# Patient Record
Sex: Female | Born: 1990 | Race: Black or African American | Hispanic: No | Marital: Married | Smoking: Never smoker
Health system: Southern US, Community
[De-identification: ages and names within clinical notes are randomized; demographics above are authoritative.]

## PROBLEM LIST (undated history)

## (undated) DIAGNOSIS — T7840XA Allergy, unspecified, initial encounter: Secondary | ICD-10-CM

## (undated) DIAGNOSIS — R7303 Prediabetes: Secondary | ICD-10-CM

## (undated) DIAGNOSIS — Z789 Other specified health status: Secondary | ICD-10-CM

## (undated) DIAGNOSIS — O139 Gestational [pregnancy-induced] hypertension without significant proteinuria, unspecified trimester: Secondary | ICD-10-CM

## (undated) DIAGNOSIS — I10 Essential (primary) hypertension: Secondary | ICD-10-CM

## (undated) HISTORY — PX: BREAST SURGERY: SHX581

## (undated) HISTORY — DX: Allergy, unspecified, initial encounter: T78.40XA

## (undated) NOTE — Progress Notes (Signed)
 Formatting of this note is different from the original. Patient: Sarah Giles Date of Birth: [birth year] Visit Date: 04/19/24   Chief Complaint    Chief Complaint  Patient presents with   Wound Check    Needs packing changed under left from abscess that was drained 2 days ago     History of Present Illness    History provided by:  Patient Language interpreter used: No    Pt had an abscess that needed drained in [place] two days ago She had packing placed, and a lot of drainage at the time. She was started on doxy Pt needs packing removed and to check if repacking is needed.    Allergies   Past Surgical History   Allergies  Allergen Reactions   Amlodipine Other and Unknown    Other Reaction(s): palpitations/chest pains  Severe adverse reaction per MD  Severe adverse reaction per MD, flushed   Bee Pollen Itching   Hydroxyzine  Palpitations   Iodinated Contrast Media Hives and Nausea And Vomiting   Other Other and Unknown   Polymyxin B-Trimethoprim Other    Eye burning   Tobramycin Other    Eye burning   History reviewed. No pertinent surgical history.     Past Medical History   Medications   History reviewed. No pertinent past medical history. There is no problem list on file for this patient.  Current Outpatient Medications:    albuterol HFA (Ventolin HFA) 90 mcg/act inhaler, 2 puffs as needed Inhalation every 4 hrs prn for cough/wheeze; Duration: 90 days, Disp: , Rfl:    Arazlo  0.045 % lotion, Apply ONE Application topically AT bedtime., Disp: , Rfl:    cetirizine (ZyrTEC) 10 MG tablet, 1 time each day at the same time., Disp: , Rfl:    clindamycin  (Cleocin  T) 1 % swab, APPLY TOPICALLY IN THE MORNING, Disp: , Rfl:    ergocalciferol (Vitamin D-2) capsule, 1 capsule., Disp: , Rfl:    fluticasone furoate (Flonase Sensimist) 27.5 MCG/SPRAY nasal spray, 1-2 sprays each nostril Nasally Once a day during seasons of difficulty; Duration: 30 days, Disp: , Rfl:    magnesium  citrate oral solution, Take 296 mL by mouth., Disp: , Rfl:    metoprolol tartrate (Lopressor) 25 MG tablet, TAKE ONE TABLET BY MOUTH 2 TIMES DAILY., Disp: , Rfl:    Multiple Vitamins-Minerals (Centrum Women) tablet, 1 tab by mouth once a day, Disp: , Rfl:    spironolactone  (Aldactone ) 100 MG tablet, Take 100 mg by mouth at bedtime., Disp: , Rfl:    Zepbound 10 MG/0.5ML pen-injector, , Disp: , Rfl:     Review of Systems   Review of Systems  Constitutional:  Negative for chills and fever.  HENT:  Negative for facial swelling, rhinorrhea and sore throat.   Eyes:  Negative for discharge, redness and visual disturbance.  Respiratory:  Negative for cough and shortness of breath.   Cardiovascular:  Negative for chest pain.  Gastrointestinal:  Negative for abdominal pain, constipation, diarrhea, nausea and vomiting.  Genitourinary:  Negative for difficulty urinating.  Musculoskeletal:  Negative for myalgias.  Skin:  Positive for wound. Negative for color change, pallor and rash.  Neurological:  Negative for dizziness, weakness, numbness and headaches.  Hematological:  Negative for adenopathy. Does not bruise/bleed easily.    Vitals and Physical Exam    Vitals:   04/19/24 1124  BP: 120/88  Pulse: 88  Temp: 36.2 C (97.2 F)  Weight: (!) 138 kg (304 lb)  Height: 1.702  m (5' 7)  BMI (Calculated): 47.6 kg/m2  BSA (Calculated - sq m): 2.55 sq meters  Resp: 16  SpO2: 98%   LMP: Patient's last menstrual period was 04/12/2024 (approximate). Physical Exam Vitals and nursing note reviewed.  Constitutional:      General: She is not in acute distress.    Appearance: Normal appearance.  HENT:     Head: Normocephalic and atraumatic.     Nose: Nose normal. No congestion or rhinorrhea.     Mouth/Throat:     Mouth: Mucous membranes are moist.     Pharynx: No oropharyngeal exudate or posterior oropharyngeal erythema.  Eyes:     General:        Right eye: No discharge.        Left eye: No  discharge.     Conjunctiva/sclera: Conjunctivae normal.  Cardiovascular:     Rate and Rhythm: Normal rate and regular rhythm.     Heart sounds: Normal heart sounds.  Pulmonary:     Effort: Pulmonary effort is normal.     Breath sounds: Normal breath sounds. No wheezing or rhonchi.  Musculoskeletal:     Cervical back: Neck supple.  Lymphadenopathy:     Cervical: No cervical adenopathy.  Skin:    General: Skin is warm.     Findings: No rash.     Comments: Small left axillar incision, no drainage, no redness, no tenderness. Small packing removed scant discharge noted on it  Neurological:     General: No focal deficit present.     Mental Status: She is alert.  Psychiatric:        Mood and Affect: Mood normal.        Behavior: Behavior normal.    Procedures - POCT - Imaging    Results for orders placed or performed in visit on 11/19/23  Basic metabolic panel   Collection Time: 11/19/23 12:38 PM  Result Value Ref Range   Glucose 72 65 - 99 mg/dL   Urea Nitrogen (BUN) 15 7 - 25 mg/dL   Creatinine 9.23 9.49 - 0.97 mg/dL   EGFR 894 > OR = 60 fO/fpw/8.26f7   BUN/Creat Ratio SEE NOTE: 6 - 22 (calc)   Sodium 136 135 - 146 mmol/L   Potassium 4.4 3.5 - 5.3 mmol/L   Chloride 104 98 - 110 mmol/L   Carbon Dioxide 25 20 - 32 mmol/L   Calcium 9.6 8.6 - 10.2 mg/dL  CBC and differential   Collection Time: 11/19/23 12:38 PM  Result Value Ref Range   White Blood Cell Count 10.9 (H) 3.8 - 10.8 Thousand/uL   Red Blood Cell Count 5.21 (H) 3.80 - 5.10 Million/uL   Hemoglobin 12.9 11.7 - 15.5 g/dL   Hemotocrit 57.8 64.9 - 45.0 %   MCV 80.8 80.0 - 100.0 fL   MCH 24.8 (L) 27.0 - 33.0 pg   MCHC 30.6 (L) 32.0 - 36.0 g/dL   RDW 85.0 88.9 - 84.9 %   Platelet Count 393 140 - 400 Thousand/uL   MPV 8.9 7.5 - 12.5 fL   Absolute Neutrophils 7,423 1,500 - 7,800 cells/uL   Absolute Lymphocytes 2,780 850 - 3,900 cells/uL   Absolute Monocytes 589 200 - 950 cells/uL   Absolute Eosinophils 44 15 - 500  cells/uL   Absolute Basophils 65 0 - 200 cells/uL   Neutrophils 68.1 %   Lymphocytes 25.5 %   Monocytes 5.4 %   Eosinophils 0.4 %   Basophils 0.6 %  Magnesium   Collection  Time: 11/19/23 12:38 PM  Result Value Ref Range   Magnesium 2.2 1.5 - 2.5 mg/dL     Medical Decision Making     Assessment and Plan   1. Encounter for removal of abscess packing (Primary)  Electronically Signed by:  Nada F Saleeb, PA-C 04/19/2024  Patient Instructions  Packing removed in office today, very scant drainage on packing, no repacking necessary at this time, pt will follow up with PCP on Monday. Pt can now shower normal and redress area when done, she was provided with dressing and wound redressed in office. Continue AB as prescribed from PCP.   Follow Up: Follow up if symptoms worsen or fail to improve.  Electronically signed by Iantha JULIANNA Pilsner, PA-C at 04/19/2024 12:19 PM EST

## (undated) NOTE — Progress Notes (Signed)
 Formatting of this note is different from the original. Patient: Sarah Giles Date of Birth: October 30, 1990 Visit Date: 11/19/23   Chief Complaint    Chief Complaint  Patient presents with   Dizziness    Pt was seen in another state and EKG was normal, BP was normal..  Pt was also Zofran  for nausea in office.  Pt just wants a follow up.    No dizziness today but does have ringing in her ears.    History of Present Illness   Sarah Giles is a 69 y.o. female presenting to Urgent Care for re-check after an episode of intense dizziness that occurred yesterday. She was traveling by car (passenger) from [place]  to [place]  when it occurred, she had turned her head kind of suddenly to the side and the episode started; she got very nauseous and vomited. She was seen at an Urgent Care along her route who did EKG and orthostatic BP both of which were normal, provided her Zofran  and suggested recheck/possible bloodwork. Patient has not had recurrence of symptoms.   Endorses abdominal pain associated with menstrual cramps, baseline. Denies LOC or recurrent episode since yesterday. Denies fevers, shortness of breath, chest pain, nausea or vomiting, changes to stools.    Allergies   Past Surgical History   Allergies  Allergen Reactions   Bee Pollen Itching   Hydroxyzine  Palpitations   Amlodipine Other and Unknown    Other Reaction(s): palpitations/chest pains  Severe adverse reaction per MD   Iodinated Contrast Media Hives and Nausea And Vomiting   Other Other and Unknown   Polymyxin B-Trimethoprim Other    Eye burning   Tobramycin Other    Eye burning   No past surgical history on file.     Past Medical History   Medications   There is no problem list on file for this patient.  Current Outpatient Medications:    albuterol HFA (Ventolin HFA) 90 mcg/act inhaler, 2 puffs as needed Inhalation every 4 hrs prn for cough/wheeze; Duration: 90 days, Disp: , Rfl:    Arazlo  0.045 % lotion, Apply  ONE Application topically AT bedtime., Disp: , Rfl:    cetirizine (ZyrTEC) 10 MG tablet, 1 time each day at the same time., Disp: , Rfl:    clindamycin  (Cleocin  T) 1 % swab, APPLY TOPICALLY IN THE MORNING, Disp: , Rfl:    ergocalciferol (Vitamin D-2) capsule, 1 capsule., Disp: , Rfl:    fluticasone furoate (Flonase Sensimist) 27.5 MCG/SPRAY nasal spray, 1-2 sprays each nostril Nasally Once a day during seasons of difficulty; Duration: 30 days, Disp: , Rfl:    metoprolol tartrate (Lopressor) 25 MG tablet, TAKE ONE TABLET BY MOUTH 2 TIMES DAILY., Disp: , Rfl:    spironolactone  (Aldactone ) 100 MG tablet, Take 100 mg by mouth at bedtime., Disp: , Rfl:    Zepbound 10 MG/0.5ML pen-injector, , Disp: , Rfl:    magnesium citrate oral solution, Take 296 mL by mouth., Disp: , Rfl:    Multiple Vitamins-Minerals (Centrum Women) tablet, 1 tab by mouth once a day, Disp: , Rfl:    ondansetron  ODT (Zofran -ODT) 4 MG disintegrating tablet, Place 1 tablet under the tongue every 8 hours if needed for nausea or vomiting for up to 3 days., Disp: 9 tablet, Rfl: 0    Vitals and Physical Exam    Vitals:   11/19/23 1147  BP: (!) 145/91  Pulse: 87  Resp: 20  Temp: 36.4 C (97.6 F)  TempSrc: Temporal  SpO2: 98%  Weight:  132 kg (292 lb)  Height: 1.676 m (5' 6)   Vision: No results found.  LMP: No LMP recorded. Physical Exam Vitals and nursing note reviewed.  Constitutional:      General: She is not in acute distress.    Appearance: Normal appearance. She is not ill-appearing or toxic-appearing.  HENT:     Right Ear: Tympanic membrane, ear canal and external ear normal.     Left Ear: Ear canal and external ear normal. A middle ear effusion is present.     Nose: Nose normal. No congestion or rhinorrhea.     Mouth/Throat:     Mouth: Mucous membranes are moist. No oral lesions.     Pharynx: Oropharynx is clear. Uvula midline. No posterior oropharyngeal erythema.  Eyes:     Conjunctiva/sclera: Conjunctivae  normal.  Cardiovascular:     Rate and Rhythm: Normal rate and regular rhythm.  Pulmonary:     Effort: Pulmonary effort is normal. No tachypnea, accessory muscle usage or respiratory distress.     Breath sounds: Normal breath sounds. No decreased breath sounds, wheezing, rhonchi or rales.  Musculoskeletal:     Cervical back: Neck supple.  Lymphadenopathy:     Cervical: No cervical adenopathy.  Skin:    General: Skin is warm and dry.     Coloration: Skin is not pale.  Neurological:     Mental Status: She is alert.    Medical Decision Making    Unclear etiology to patient's dizziness episode yesterday, but I suspect has to do with middle ear effusion and elevation changes/travel. Pt endorses hx anemia - will recheck this and basic electrolytes to make sure that is not contributing factor. Overall recommend meclizine  if she can tolerate, but avoiding exacerbating head movements and when she travels again to take pseudoephedrine beforehand, have Zofran  if needed. Follow up with PCP. Discussed ED precautions. Other education/discussion as below. Pt verbalized understanding and agrees with this treatment plan.   Assessment and Plan   1. Episode of dizziness (Primary) -     Basic metabolic panel -     CBC and differential -     Magnesium 2. Nausea -     ondansetron  ODT (Zofran -ODT) 4 MG disintegrating tablet; Place 1 tablet under the tongue every 8 hours if needed for nausea or vomiting for up to 3 days.  Electronically Signed by:  Heather Stallion, PA-C 11/19/2023  Patient Instructions  You were seen today for DIZZINESS, which can be caused by many conditions. Based on your symptoms and physical exam, I suspect your dizziness is caused by a condition of the middle ear/fluid and pressure changes associated with car travel across states.  We are ordering lab tests to further evaluate your symptoms. We will call with results, but the easiest/fastest way to obtain your results is by signing up  for the [facility] patient portal. Most standard labs return same or next day.    -If some of your results are abnormal but stable, it is very important to follow up with your primary care provider (PCP) to discuss next steps or treatment. If any of your results are significantly abnormal or at dangerous/unstable levels, we may refer you to the emergency department for further evaluation and treatment.  AVOID TRIGGERS such as: rapidly changing head positions, looking up or down suddenly, rolling over in bed quickly.  MEDICATIONS: (for short-term use only) -antihistamines like meclizine  or diphenhydramine  (Benadryl ) can help with symptoms of vertigo -antiemetics like ondansetron  (Zofran ) can help with nausea  associated with BPPV  FOLLOW UP: If your symptoms persist or worsen, follow up with your primary care provider (PCP). They may recommend additional treatments, such as vestibular rehabilitation therapy, which involves exercises to improve balance and reduce dizziness.  SEEK EMERGENCY ROOM (ER) CARE IF YOU EXPERIENCE: -severe, persistent vertigo that does not improve with repositioning maneuvers -vertigo accompanied by severe headaches, weakness, double vision or vision changes, or difficulty speaking -sudden hearing loss or ringing in the ears -sudden severe headache (worst headache of your life with quick onset), or acutely worsening and intolerable headaches not relieved with Tylenol /ibuprofen  -profuse nausea/vomiting -severe neck pain -sudden trouble walking or difficulty with moving any part of your body  Follow Up: No follow-ups on file.  Electronically signed by Heather Stallion, PA-C at 11/19/2023  5:20 PM EDT

---
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
## 2011-10-27 ENCOUNTER — Other Ambulatory Visit (HOSPITAL_COMMUNITY)
Admission: RE | Admit: 2011-10-27 | Discharge: 2011-10-27 | Disposition: A | Discharge status: Home / Self Care | Payer: No Typology Code available for payment source | Source: Ambulatory Visit | Attending: Obstetrics and Gynecology | Admitting: Obstetrics and Gynecology

## 2011-10-27 DIAGNOSIS — Z01419 Encounter for gynecological examination (general) (routine) without abnormal findings: Secondary | ICD-10-CM | POA: Insufficient documentation

## 2011-10-27 DIAGNOSIS — Z113 Encounter for screening for infections with a predominantly sexual mode of transmission: Secondary | ICD-10-CM | POA: Insufficient documentation

## 2011-10-27 DIAGNOSIS — N760 Acute vaginitis: Secondary | ICD-10-CM | POA: Insufficient documentation

## 2012-07-21 ENCOUNTER — Other Ambulatory Visit: Payer: Self-pay | Admitting: Nurse Practitioner

## 2012-07-21 ENCOUNTER — Other Ambulatory Visit (HOSPITAL_COMMUNITY)
Admission: RE | Admit: 2012-07-21 | Discharge: 2012-07-21 | Disposition: A | Discharge status: Home / Self Care | Payer: No Typology Code available for payment source | Source: Ambulatory Visit | Attending: Obstetrics and Gynecology | Admitting: Obstetrics and Gynecology

## 2012-07-21 DIAGNOSIS — N760 Acute vaginitis: Secondary | ICD-10-CM | POA: Insufficient documentation

## 2012-12-08 ENCOUNTER — Other Ambulatory Visit: Payer: Self-pay | Admitting: Nurse Practitioner

## 2012-12-08 ENCOUNTER — Other Ambulatory Visit (HOSPITAL_COMMUNITY)
Admission: RE | Admit: 2012-12-08 | Discharge: 2012-12-08 | Disposition: A | Discharge status: Home / Self Care | Payer: BC Managed Care – PPO | Source: Ambulatory Visit | Attending: Obstetrics and Gynecology | Admitting: Obstetrics and Gynecology

## 2012-12-08 DIAGNOSIS — Z01419 Encounter for gynecological examination (general) (routine) without abnormal findings: Secondary | ICD-10-CM | POA: Insufficient documentation

## 2012-12-08 DIAGNOSIS — Z113 Encounter for screening for infections with a predominantly sexual mode of transmission: Secondary | ICD-10-CM | POA: Insufficient documentation

## 2013-02-16 ENCOUNTER — Ambulatory Visit (INDEPENDENT_AMBULATORY_CARE_PROVIDER_SITE_OTHER): Payer: BC Managed Care – PPO | Admitting: Physician Assistant

## 2013-02-16 VITALS — BP 138/96 | HR 130 | Temp 99.9°F | Resp 20 | Ht 67.0 in | Wt 279.8 lb

## 2013-02-16 DIAGNOSIS — J029 Acute pharyngitis, unspecified: Secondary | ICD-10-CM

## 2013-02-16 LAB — POCT CBC
Granulocyte percent: 41.8 %G (ref 37–80)
Hemoglobin: 10.8 g/dL — AB (ref 12.2–16.2)
MID (cbc): 0.6 (ref 0–0.9)
MPV: 6.3 fL (ref 0–99.8)
POC Granulocyte: 2.7 (ref 2–6.9)
POC MID %: 9.1 %M (ref 0–12)
Platelet Count, POC: 236 10*3/uL (ref 142–424)
RBC: 4.39 M/uL (ref 4.04–5.48)
WBC: 6.4 10*3/uL (ref 4.6–10.2)

## 2013-02-16 LAB — POCT RAPID STREP A (OFFICE): Rapid Strep A Screen: NEGATIVE

## 2013-02-16 MED ORDER — AMOXICILLIN-POT CLAVULANATE 875-125 MG PO TABS: 1.0000 | ORAL_TABLET | Freq: Two times a day (BID) | ORAL | Status: DC

## 2013-02-16 NOTE — Progress Notes (Signed)
Patient ID: Sarah Giles MRN: [id number], DOB: 12/11/1990, 22 y.o. Date of Encounter: 02/16/2013, 8:54 PM  Primary Physician: No PCP Per Patient  Chief Complaint:  Chief Complaint  Patient presents with  . Sore Throat    right side very sore per patient  . Otitis Media    right ear pain  . Nasal Congestion    HPI: 22 y.o. female presents with a 3-4 day history of sore throat, fever, chills, headache, and right otalgia. Subjective fever and chills. Mild cough and congestion that developed today. No rhinorrhea or sinus pressure. Normal hearing. No GI complaints. Able to swallow saliva, but hurts to do so. Decreased appetite secondary to sore throat. Several sick contacts at work the previous week.   She did have a headache the previous week, but thinks that was secondary to not wearing her glasses. Once she started wearing her glasses again the headache resolved.   Past Medical History  Diagnosis Date  . Allergy      Home Meds: Prior to Admission medications   Medication Sig Start Date End Date Taking? Authorizing Provider  etonogestrel-ethinyl estradiol (NUVARING) 0.12-0.015 MG/24HR vaginal ring Place 1 each vaginally every 28 (twenty-eight) days. Insert vaginally and leave in place for 3 consecutive weeks, then remove for 1 week.   Yes Historical Provider, MD    Allergies: No Known Allergies  History   Social History  . Marital Status: Unknown    Spouse Name: N/A    Number of Children: N/A  . Years of Education: N/A   Occupational History  . Not on file.   Social History Main Topics  . Smoking status: Never Smoker   . Smokeless tobacco: Not on file  . Alcohol Use: 1.5 oz/week    3 drink(s) per week  . Drug Use: No  . Sexual Activity: Not on file   Other Topics Concern  . Not on file   Social History Narrative  . No narrative on file     Review of Systems: Constitutional: positive for fever and chills HEENT: see above Cardiovascular: negative for chest  pain or palpitations Respiratory: negative for wheezing, or shortness of breath Abdominal: negative for abdominal pain, nausea, vomiting or diarrhea Dermatological: negative for rash Neurologic: positive for headache   Physical Exam: Blood pressure 138/96, pulse 130, temperature 99.9 F (37.7 C), temperature source Oral, resp. rate 20, height 5\' 7"  (1.702 m), weight 279 lb 12.8 oz (126.916 kg), last menstrual period 02/02/2013, SpO2 99.00%., Body mass index is 43.81 kg/(m^2). Pulse reheck at 92.Patient states that she had just gotten off the phone call that caused her some stress.  General: Well developed, well nourished, in no acute distress. Well appearing.  Head: Normocephalic, atraumatic, eyes without discharge, sclera non-icteric, nares are patent. Bilateral auditory canals clear, TM's are without perforation, left TM pearly grey with reflective cone of light. Right TM mildly erythematous. No sinus TTP. Oral cavity moist, dentition normal. Posterior pharynx erythematous and with exudate bilaterally. Tonsils 2+ bilaterally. No post nasal drip or peritonsillar abscess. Uvula midline.  Neck: Supple. No thyromegaly. Full ROM. Lymph nodes: less than 2 cm AC bilaterally. Lungs: Clear bilaterally to auscultation without wheezes, rales, or rhonchi. Breathing is unlabored. Heart: RRR with S1 S2. No murmurs, rubs, or gallops appreciated. Msk:  Strength and tone normal for age. Extremities: No clubbing or cyanosis. No edema. Neuro: Alert and oriented X 3. Moves all extremities spontaneously. CNII-XII grossly in tact. Psych:  Responds to questions appropriately with a  normal affect.   Labs: Results for orders placed in visit on 02/16/13  POCT RAPID STREP A (OFFICE)      Result Value Range   Rapid Strep A Screen Negative  Negative  POCT CBC      Result Value Range   WBC 6.4  4.6 - 10.2 K/uL   Lymph, poc 3.1  0.6 - 3.4   POC LYMPH PERCENT 49.1  10 - 50 %L   MID (cbc) 0.6  0 - 0.9   POC MID %  9.1  0 - 12 %M   POC Granulocyte 2.7  2 - 6.9   Granulocyte percent 41.8  37 - 80 %G   RBC 4.39  4.04 - 5.48 M/uL   Hemoglobin 10.8 (*) 12.2 - 16.2 g/dL   HCT, POC 14.7 (*) 82.9 - 47.9 %   MCV 80.6  80 - 97 fL   MCH, POC 24.6 (*) 27 - 31.2 pg   MCHC 30.5 (*) 31.8 - 35.4 g/dL   RDW, POC 56.2     Platelet Count, POC 236  142 - 424 K/uL   MPV 6.3  0 - 99.8 fL     Throat culture pending  ASSESSMENT AND PLAN:  22 y.o. female with exudative pharyngitis  -Augmentin 875/125 mg 1 po bid #20 no RF -Await culture results -New toothbrush -Tylenol/Motrin prn -Rest/fluids -RTC precautions -RTC 3-5 days if no improvement  Signed, Eula Listen, PA-C [facility] [place], [place] [address] [phone] 02/16/2013 8:54 PM

## 2013-07-05 ENCOUNTER — Other Ambulatory Visit (HOSPITAL_COMMUNITY)
Admission: RE | Admit: 2013-07-05 | Discharge: 2013-07-05 | Disposition: A | Discharge status: Home / Self Care | Payer: 59 | Source: Ambulatory Visit | Attending: Obstetrics & Gynecology | Admitting: Obstetrics & Gynecology

## 2013-07-05 ENCOUNTER — Other Ambulatory Visit: Payer: Self-pay | Admitting: Obstetrics & Gynecology

## 2013-07-05 DIAGNOSIS — Z01419 Encounter for gynecological examination (general) (routine) without abnormal findings: Secondary | ICD-10-CM | POA: Insufficient documentation

## 2013-08-28 LAB — OB RESULTS CONSOLE RPR: RPR: NONREACTIVE

## 2013-08-28 LAB — OB RESULTS CONSOLE ANTIBODY SCREEN: Antibody Screen: NEGATIVE

## 2013-08-28 LAB — OB RESULTS CONSOLE HIV ANTIBODY (ROUTINE TESTING): HIV: NONREACTIVE

## 2013-08-28 LAB — OB RESULTS CONSOLE ABO/RH: RH Type: POSITIVE

## 2013-08-28 LAB — OB RESULTS CONSOLE HEPATITIS B SURFACE ANTIGEN: Hepatitis B Surface Ag: NEGATIVE

## 2013-08-28 LAB — OB RESULTS CONSOLE GC/CHLAMYDIA
Chlamydia: NEGATIVE
GC PROBE AMP, GENITAL: NEGATIVE

## 2013-08-28 LAB — OB RESULTS CONSOLE RUBELLA ANTIBODY, IGM: Rubella: IMMUNE

## 2013-12-03 ENCOUNTER — Ambulatory Visit: Payer: Self-pay

## 2013-12-03 ENCOUNTER — Ambulatory Visit (INDEPENDENT_AMBULATORY_CARE_PROVIDER_SITE_OTHER): Payer: 59 | Admitting: Emergency Medicine

## 2013-12-03 ENCOUNTER — Ambulatory Visit (INDEPENDENT_AMBULATORY_CARE_PROVIDER_SITE_OTHER): Payer: 59

## 2013-12-03 VITALS — BP 136/84 | HR 110 | Temp 97.9°F | Resp 20 | Ht 67.0 in | Wt 292.0 lb

## 2013-12-03 DIAGNOSIS — M25579 Pain in unspecified ankle and joints of unspecified foot: Secondary | ICD-10-CM

## 2013-12-03 DIAGNOSIS — S99919A Unspecified injury of unspecified ankle, initial encounter: Secondary | ICD-10-CM

## 2013-12-03 DIAGNOSIS — M79675 Pain in left toe(s): Secondary | ICD-10-CM

## 2013-12-03 DIAGNOSIS — M79609 Pain in unspecified limb: Secondary | ICD-10-CM

## 2013-12-03 DIAGNOSIS — S99921A Unspecified injury of right foot, initial encounter: Secondary | ICD-10-CM

## 2013-12-03 DIAGNOSIS — M25571 Pain in right ankle and joints of right foot: Secondary | ICD-10-CM

## 2013-12-03 DIAGNOSIS — S99929A Unspecified injury of unspecified foot, initial encounter: Secondary | ICD-10-CM

## 2013-12-03 DIAGNOSIS — S8990XA Unspecified injury of unspecified lower leg, initial encounter: Secondary | ICD-10-CM

## 2013-12-03 NOTE — Progress Notes (Signed)
[facility] [address], [place] [id number] [phone]  Date:  12/03/2013   Name:  Sarah Giles   DOB:  Jun 08, 1990   MRN:  [id number]  PCP:  No PCP Per Patient    Chief Complaint: Fall   History of Present Illness:  Sarah Giles is a 23 y.o. very pleasant female patient who presents with the following:  Stepped off a step in [place] and twisted her foot this morning.  Flew home and now here for evaluation of her painful, tender, swollen foot. Has pain in left great toe.  No swelling or bruising. [redacted] weeks pregnant. No cramping, pain, or vaginal bleeding. No orthostatic changes. No improvement with over the counter medications or other home remedies. Denies other complaint or health concern today.   There are no active problems to display for this patient.   Past Medical History  Diagnosis Date  . Allergy     Past Surgical History  Procedure Laterality Date  . Breast surgery      2008    History  Substance Use Topics  . Smoking status: Never Smoker   . Smokeless tobacco: Not on file  . Alcohol Use: 1.5 oz/week    3 drink(s) per week    Family History  Problem Relation Age of Onset  . Hypertension Mother   . Diabetes Father   . Hypertension Maternal Grandmother   . Diabetes Paternal Grandfather     No Known Allergies  Medication list has been reviewed and updated.  No current outpatient prescriptions on file prior to visit.   No current facility-administered medications on file prior to visit.    Review of Systems:  As per HPI, otherwise negative.    Physical Examination: Filed Vitals:   12/03/13 1314  BP: 136/84  Pulse: 110  Temp: 97.9 F (36.6 C)  Resp: 20   Filed Vitals:   12/03/13 1314  Height: 5\' 7"  (1.702 m)  Weight: 292 lb (132.45 kg)   Body mass index is 45.72 kg/(m^2). Ideal Body Weight: Weight in (lb) to have BMI = 25: 159.3   GEN: WDWN, NAD, Non-toxic, Alert & Oriented x 3 HEENT: Atraumatic,  Normocephalic.  Ears and Nose: No external deformity. EXTR: No clubbing/cyanosis/edema NEURO: Normal gait.  PSYCH: Normally interactive. Conversant. Not depressed or anxious appearing.  Calm demeanor.  RIGHT ankle.  Stable and not tender.  No effusion or ecchymosis RIGHT foot:  Tender lateral foot.  No deformity.  guards LEFT great toe:  Tender middle phalange.  No no deformity or ecchymosis  Assessment and Plan: Sprain foot Boot RICE   Signed,  Phillips OdorJeffery Deionte Spivack, MD   [facility] reading (PRIMARY) by  Dr. Dareen PianoAnderson.  No osseous injury toe or foot.

## 2013-12-03 NOTE — Patient Instructions (Signed)
Foot Sprain The muscles and cord like structures which attach muscle to bone (tendons) that surround the feet are made up of units. A foot sprain can occur at the weakest spot in any of these units. This condition is most often caused by injury to or overuse of the foot, as from playing contact sports, or aggravating a previous injury, or from poor conditioning, or obesity. SYMPTOMS  Pain with movement of the foot.  Tenderness and swelling at the injury site.  Loss of strength is present in moderate or severe sprains. THE THREE GRADES OR SEVERITY OF FOOT SPRAIN ARE:  Mild (Grade I): Slightly pulled muscle without tearing of muscle or tendon fibers or loss of strength.  Moderate (Grade II): Tearing of fibers in a muscle, tendon, or at the attachment to bone, with small decrease in strength.  Severe (Grade III): Rupture of the muscle-tendon-bone attachment, with separation of fibers. Severe sprain requires surgical repair. Often repeating (chronic) sprains are caused by overuse. Sudden (acute) sprains are caused by direct injury or over-use. DIAGNOSIS  Diagnosis of this condition is usually by your own observation. If problems continue, a caregiver may be required for further evaluation and treatment. X-rays may be required to make sure there are not breaks in the bones (fractures) present. Continued problems may require physical therapy for treatment. PREVENTION  Use strength and conditioning exercises appropriate for your sport.  Warm up properly prior to working out.  Use athletic shoes that are made for the sport you are participating in.  Allow adequate time for healing. Early return to activities makes repeat injury more likely, and can lead to an unstable arthritic foot that can result in prolonged disability. Mild sprains generally heal in 3 to 10 days, with moderate and severe sprains taking 2 to 10 weeks. Your caregiver can help you determine the proper time required for  healing. HOME CARE INSTRUCTIONS   Apply ice to the injury for 15-20 minutes, 03-04 times per day. Put the ice in a plastic bag and place a towel between the bag of ice and your skin.  An elastic wrap (like an Ace bandage) may be used to keep swelling down.  Keep foot above the level of the heart, or at least raised on a footstool, when swelling and pain are present.  Try to avoid use other than gentle range of motion while the foot is painful. Do not resume use until instructed by your caregiver. Then begin use gradually, not increasing use to the point of pain. If pain does develop, decrease use and continue the above measures, gradually increasing activities that do not cause discomfort, until you gradually achieve normal use.  Use crutches if and as instructed, and for the length of time instructed.  Keep injured foot and ankle wrapped between treatments.  Massage foot and ankle for comfort and to keep swelling down. Massage from the toes up towards the knee.  Only take over-the-counter or prescription medicines for pain, discomfort, or fever as directed by your caregiver. SEEK IMMEDIATE MEDICAL CARE IF:   Your pain and swelling increase, or pain is not controlled with medications.  You have loss of feeling in your foot or your foot turns cold or blue.  You develop new, unexplained symptoms, or an increase of the symptoms that brought you to your caregiver. MAKE SURE YOU:   Understand these instructions.  Will watch your condition.  Will get help right away if you are not doing well or get worse. Document Released:   10/03/2001 Document Revised: 07/06/2011 Document Reviewed: 12/01/2007 ExitCare Patient Information 2015 [employer]. This information is not intended to replace advice given to you by your health care provider. Make sure you discuss any questions you have with your health care provider.  

## 2014-02-16 LAB — OB RESULTS CONSOLE GBS: STREP GROUP B AG: NEGATIVE

## 2014-03-29 ENCOUNTER — Telehealth: Payer: Self-pay

## 2014-04-10 NOTE — Telephone Encounter (Signed)
No msg °

## 2014-04-18 LAB — OB RESULTS CONSOLE GBS: GBS: NEGATIVE

## 2014-04-27 ENCOUNTER — Inpatient Hospital Stay (HOSPITAL_COMMUNITY): Payer: Medicaid Other | Admitting: Anesthesiology

## 2014-04-27 ENCOUNTER — Encounter (HOSPITAL_COMMUNITY): Payer: Self-pay | Admitting: *Deleted

## 2014-04-27 ENCOUNTER — Inpatient Hospital Stay (HOSPITAL_COMMUNITY)
Admission: AD | Admit: 2014-04-27 | Discharge: 2014-04-30 | DRG: 775 | Disposition: A | Discharge status: Home / Self Care | Payer: Medicaid Other | Source: Ambulatory Visit | Attending: Obstetrics & Gynecology | Admitting: Obstetrics & Gynecology

## 2014-04-27 DIAGNOSIS — O133 Gestational [pregnancy-induced] hypertension without significant proteinuria, third trimester: Secondary | ICD-10-CM | POA: Diagnosis present

## 2014-04-27 DIAGNOSIS — Z6841 Body Mass Index (BMI) 40.0 and over, adult: Secondary | ICD-10-CM

## 2014-04-27 DIAGNOSIS — O9081 Anemia of the puerperium: Secondary | ICD-10-CM | POA: Diagnosis present

## 2014-04-27 DIAGNOSIS — Z3A39 39 weeks gestation of pregnancy: Secondary | ICD-10-CM | POA: Diagnosis present

## 2014-04-27 DIAGNOSIS — O99214 Obesity complicating childbirth: Secondary | ICD-10-CM | POA: Diagnosis present

## 2014-04-27 DIAGNOSIS — D6489 Other specified anemias: Secondary | ICD-10-CM | POA: Diagnosis present

## 2014-04-27 DIAGNOSIS — O139 Gestational [pregnancy-induced] hypertension without significant proteinuria, unspecified trimester: Secondary | ICD-10-CM | POA: Diagnosis present

## 2014-04-27 HISTORY — DX: Gestational (pregnancy-induced) hypertension without significant proteinuria, unspecified trimester: O13.9

## 2014-04-27 HISTORY — DX: Morbid (severe) obesity due to excess calories: E66.01

## 2014-04-27 HISTORY — DX: Other specified health status: Z78.9

## 2014-04-27 LAB — CBC
HCT: 35.4 % — ABNORMAL LOW (ref 36.0–46.0)
HEMOGLOBIN: 11.5 g/dL — AB (ref 12.0–15.0)
MCH: 25.7 pg — AB (ref 26.0–34.0)
MCHC: 32.5 g/dL (ref 30.0–36.0)
MCV: 79.2 fL (ref 78.0–100.0)
PLATELETS: 268 10*3/uL (ref 150–400)
RBC: 4.47 MIL/uL (ref 3.87–5.11)
RDW: 15 % (ref 11.5–15.5)
WBC: 14.6 10*3/uL — AB (ref 4.0–10.5)

## 2014-04-27 LAB — LACTATE DEHYDROGENASE: LDH: 236 U/L (ref 94–250)

## 2014-04-27 LAB — COMPREHENSIVE METABOLIC PANEL
ALBUMIN: 3.1 g/dL — AB (ref 3.5–5.2)
ALK PHOS: 121 U/L — AB (ref 39–117)
ALT: 16 U/L (ref 0–35)
AST: 26 U/L (ref 0–37)
Anion gap: 9 (ref 5–15)
BILIRUBIN TOTAL: 0.4 mg/dL (ref 0.3–1.2)
BUN: 5 mg/dL — ABNORMAL LOW (ref 6–23)
CHLORIDE: 109 meq/L (ref 96–112)
CO2: 19 mmol/L (ref 19–32)
CREATININE: 0.52 mg/dL (ref 0.50–1.10)
Calcium: 8.9 mg/dL (ref 8.4–10.5)
GFR calc non Af Amer: >90 mL/min (ref >90)
Glucose, Bld: 106 mg/dL — ABNORMAL HIGH (ref 70–99)
Potassium: 3.5 mmol/L (ref 3.5–5.1)
Sodium: 137 mmol/L (ref 135–145)
Total Protein: 6.4 g/dL (ref 6.0–8.3)

## 2014-04-27 LAB — TYPE AND SCREEN
ABO/RH(D): B POS
Antibody Screen: NEGATIVE

## 2014-04-27 LAB — PROTEIN / CREATININE RATIO, URINE
CREATININE, URINE: 114 mg/dL
Protein Creatinine Ratio: 0.2 — ABNORMAL HIGH (ref 0.00–0.15)
TOTAL PROTEIN, URINE: 23 mg/dL

## 2014-04-27 LAB — ABO/RH: ABO/RH(D): B POS

## 2014-04-27 LAB — URIC ACID: Uric Acid, Serum: 4.8 mg/dL (ref 2.4–7.0)

## 2014-04-27 LAB — RAPID HIV SCREEN (WH-MAU): SUDS RAPID HIV SCREEN: NONREACTIVE

## 2014-04-27 MED ORDER — CITRIC ACID-SODIUM CITRATE 334-500 MG/5ML PO SOLN: 30.0000 mL | ORAL | Status: DC | PRN

## 2014-04-27 MED ORDER — LACTATED RINGERS IV SOLN
INTRAVENOUS | Status: DC
  Administered 2014-04-27: 16:00:00 via INTRAVENOUS

## 2014-04-27 MED ORDER — BUTORPHANOL TARTRATE 1 MG/ML IJ SOLN: 1.0000 mg | INTRAMUSCULAR | Status: DC | PRN

## 2014-04-27 MED ORDER — ZOLPIDEM TARTRATE 5 MG PO TABS: 5.0000 mg | ORAL_TABLET | Freq: Every evening | ORAL | Status: DC | PRN

## 2014-04-27 MED ORDER — OXYTOCIN BOLUS FROM INFUSION
500.0000 mL | INTRAVENOUS | Status: DC
  Administered 2014-04-28: 500 mL via INTRAVENOUS

## 2014-04-27 MED ORDER — EPHEDRINE 5 MG/ML INJ
10.0000 mg | INTRAVENOUS | Status: DC | PRN
  Filled 2014-04-27: qty 2

## 2014-04-27 MED ORDER — LACTATED RINGERS IV SOLN
INTRAVENOUS | Status: DC
  Administered 2014-04-27 – 2014-04-28 (×2): via INTRAVENOUS

## 2014-04-27 MED ORDER — LABETALOL HCL 5 MG/ML IV SOLN: 20.0000 mg | INTRAVENOUS | Status: DC | PRN

## 2014-04-27 MED ORDER — OXYCODONE-ACETAMINOPHEN 5-325 MG PO TABS: 2.0000 | ORAL_TABLET | ORAL | Status: DC | PRN

## 2014-04-27 MED ORDER — LACTATED RINGERS IV SOLN
500.0000 mL | Freq: Once | INTRAVENOUS | Status: AC
  Administered 2014-04-27: 500 mL via INTRAVENOUS

## 2014-04-27 MED ORDER — ONDANSETRON HCL 4 MG/2ML IJ SOLN: 4.0000 mg | Freq: Four times a day (QID) | INTRAMUSCULAR | Status: DC | PRN

## 2014-04-27 MED ORDER — OXYTOCIN 40 UNITS IN LACTATED RINGERS INFUSION - SIMPLE MED: 62.5000 mL/h | INTRAVENOUS | Status: DC

## 2014-04-27 MED ORDER — PHENYLEPHRINE 40 MCG/ML (10ML) SYRINGE FOR IV PUSH (FOR BLOOD PRESSURE SUPPORT)
80.0000 ug | PREFILLED_SYRINGE | INTRAVENOUS | Status: DC | PRN
  Filled 2014-04-27: qty 2

## 2014-04-27 MED ORDER — FENTANYL 2.5 MCG/ML BUPIVACAINE 1/10 % EPIDURAL INFUSION (WH - ANES)
14.0000 mL/h | INTRAMUSCULAR | Status: DC | PRN
  Administered 2014-04-27 – 2014-04-28 (×2): 14 mL/h via EPIDURAL
  Filled 2014-04-27 (×2): qty 125

## 2014-04-27 MED ORDER — LIDOCAINE HCL (PF) 1 % IJ SOLN
30.0000 mL | INTRAMUSCULAR | Status: DC | PRN
  Filled 2014-04-27: qty 30

## 2014-04-27 MED ORDER — LACTATED RINGERS IV SOLN: 500.0000 mL | INTRAVENOUS | Status: DC | PRN

## 2014-04-27 MED ORDER — ACETAMINOPHEN 325 MG PO TABS: 650.0000 mg | ORAL_TABLET | ORAL | Status: DC | PRN

## 2014-04-27 MED ORDER — TERBUTALINE SULFATE 1 MG/ML IJ SOLN: 0.2500 mg | Freq: Once | INTRAMUSCULAR | Status: AC | PRN

## 2014-04-27 MED ORDER — LACTATED RINGERS IV SOLN
INTRAVENOUS | Status: DC
  Administered 2014-04-27 – 2014-04-28 (×2): via INTRAUTERINE

## 2014-04-27 MED ORDER — PHENYLEPHRINE 40 MCG/ML (10ML) SYRINGE FOR IV PUSH (FOR BLOOD PRESSURE SUPPORT)
80.0000 ug | PREFILLED_SYRINGE | INTRAVENOUS | Status: DC | PRN
  Filled 2014-04-27: qty 2
  Filled 2014-04-27: qty 10

## 2014-04-27 MED ORDER — FLEET ENEMA 7-19 GM/118ML RE ENEM
1.0000 | ENEMA | RECTAL | Status: DC | PRN
Start: 2014-04-27 — End: 2014-04-28

## 2014-04-27 MED ORDER — OXYTOCIN 40 UNITS IN LACTATED RINGERS INFUSION - SIMPLE MED
1.0000 m[IU]/min | INTRAVENOUS | Status: DC
  Administered 2014-04-27: 2 m[IU]/min via INTRAVENOUS
  Filled 2014-04-27: qty 1000

## 2014-04-27 MED ORDER — OXYCODONE-ACETAMINOPHEN 5-325 MG PO TABS: 1.0000 | ORAL_TABLET | ORAL | Status: DC | PRN

## 2014-04-27 MED ORDER — DIPHENHYDRAMINE HCL 50 MG/ML IJ SOLN: 12.5000 mg | INTRAMUSCULAR | Status: DC | PRN

## 2014-04-27 NOTE — Progress Notes (Signed)
  Subjective: Assumed care of this 24 yo G1P0 @ 39.5 wks who was admitted for elevated BPs. Preeclampsia w/u neg and PCR 0.20. Denies h/a, visual changes, RUQ pain, CP, SOB, VB, LOF or N/V. Reports active fetus. Epidural placed ~ 15 min ago and pt comfortable. FOB at bedside.  Objective: BP 147/89 mmHg  Pulse 117  Temp(Src) 98.2 F (36.8 C) (Oral)  Resp 18  Ht 5' 7.5" (1.715 m)  Wt 305 lb 9.6 oz (138.619 kg)  BMI 47.13 kg/m2  SpO2 100%      Filed Vitals:   04/27/14 1918  BP: 107/87  Pulse: 108  Temp:   Resp: 18   Lungs: CTAB CV: RRR w/o murmur Abdomen: gravid, soft, NT; EFW 8 1/4 lbs Ext: 1+ DTRs bilaterally, no clonus. 1+ lower extremity swelling FHT: BL 135 w/ moderate variability, +accels, occ variable UC:   irregular, every 2-3 minutes, palpate mild SVE: Deferred    Pitocin at 2 mius/min  Assessment:  IUP at 39.5 wks GHTN GBS neg Early labor Obesity  Plan: Continue w/ current management plan Labetalol parameters prn Consult prn Anticipate progress  Sherre Scarlet CNM 04/27/2014, 7:15 PM

## 2014-04-27 NOTE — Progress Notes (Signed)
Report received, care assumed.

## 2014-04-27 NOTE — Progress Notes (Signed)
Subjective: Now on Birthing Suite--FOB at bedside.  Objective: BP 155/82 mmHg  Pulse 86  Temp(Src) 98.2 F (36.8 C) (Oral)  Resp 18  Ht 5' 7.5" (1.715 m)  Wt 305 lb 9.6 oz (138.619 kg)  BMI 47.13 kg/m2  SpO2 100%      Filed Vitals:   04/27/14 1554 04/27/14 1600 04/27/14 1617 04/27/14 1637  BP: 149/93 146/84 147/82 155/82  Pulse: 83 83 85 86  Temp:    98.2 F (36.8 C)  TempSrc:    Oral  Resp:    18  Height:      Weight:      SpO2:        FHT: Category 1 UC:   irregular, every 5-7 minutes SVE:   Deferred  Results for orders placed or performed during the hospital encounter of 04/27/14 (from the past 24 hour(s))  CBC     Status: Abnormal   Collection Time: 04/27/14  3:40 PM  Result Value Ref Range   WBC 14.6 (H) 4.0 - 10.5 K/uL   RBC 4.47 3.87 - 5.11 MIL/uL   Hemoglobin 11.5 (L) 12.0 - 15.0 g/dL   HCT 16.1 (L) 09.6 - 04.5 %   MCV 79.2 78.0 - 100.0 fL   MCH 25.7 (L) 26.0 - 34.0 pg   MCHC 32.5 30.0 - 36.0 g/dL   RDW 40.9 81.1 - 91.4 %   Platelets 268 150 - 400 K/uL  Comprehensive metabolic panel     Status: Abnormal   Collection Time: 04/27/14  3:40 PM  Result Value Ref Range   Sodium 137 135 - 145 mmol/L   Potassium 3.5 3.5 - 5.1 mmol/L   Chloride 109 96 - 112 mEq/L   CO2 19 19 - 32 mmol/L   Glucose, Bld 106 (H) 70 - 99 mg/dL   BUN 5 (L) 6 - 23 mg/dL   Creatinine, Ser 7.82 0.50 - 1.10 mg/dL   Calcium 8.9 8.4 - 95.6 mg/dL   Total Protein 6.4 6.0 - 8.3 g/dL   Albumin 3.1 (L) 3.5 - 5.2 g/dL   AST 26 0 - 37 U/L   ALT 16 0 - 35 U/L   Alkaline Phosphatase 121 (H) 39 - 117 U/L   Total Bilirubin 0.4 0.3 - 1.2 mg/dL   GFR calc non Af Amer >90 >90 mL/min   GFR calc Af Amer >90 >90 mL/min   Anion gap 9 5 - 15  Lactate dehydrogenase     Status: None   Collection Time: 04/27/14  3:40 PM  Result Value Ref Range   LDH 236 94 - 250 U/L  Uric acid     Status: None   Collection Time: 04/27/14  3:40 PM  Result Value Ref Range   Uric Acid, Serum 4.8 2.4 - 7.0 mg/dL   Type and screen     Status: None (Preliminary result)   Collection Time: 04/27/14  3:40 PM  Result Value Ref Range   ABO/RH(D) B POS    Antibody Screen PENDING    Sample Expiration 04/30/2014   Rapid HIV screen     Status: None   Collection Time: 04/27/14  3:40 PM  Result Value Ref Range   SUDS Rapid HIV Screen NON REACTIVE NON REACTIVE   PCR pending  Assessment:  Early labor Gestational hypertension vs pre-eclampsia GBS negative  Plan: Await PCR results Continue observation of labor status. If no progression in next 2 hours, will recommend augmentation. Patient likely will plan epidural.  Nigel Bridgeman CNM  04/27/2014, 4:54 PM

## 2014-04-27 NOTE — L&D Delivery Note (Signed)
Delivery Note At 5:23 AM a viable female, "Sarah Giles", was delivered via Vaginal, Spontaneous Delivery (Presentation: OA restituting to LOA).  APGARS:   ; weight pending.   Placenta status: Intact, spontaneous. Cord: 3 vessels with the following complications: Wrapped tightly around neck and chest; infant somersaulted through. Cord pH: NA  Anesthesia: Epidural   Episiotomy:  NA Lacerations: Small vaginal lac; repaired w/ one interrupted stitch Suture Repair: 3.0 vicryl CT-1 Est. Blood Loss (mL):  300  Mom to postpartum.  Baby to Couplet care / Skin to Skin.  Mom plans to breastfeed.  Undecided re: birth control.  Couple plans outpatient circ.  Sherre Scarlet 04/28/2014, 5:57 AM

## 2014-04-27 NOTE — Progress Notes (Signed)
  Subjective: Breathing with some UCs, reports some are stronger.    Objective: BP 145/82 mmHg  Pulse 88  Temp(Src) 98.2 F (36.8 C) (Oral)  Resp 18  Ht 5' 7.5" (1.715 m)  Wt 305 lb 9.6 oz (138.619 kg)  BMI 47.13 kg/m2  SpO2 100%      Filed Vitals:   04/27/14 1617 04/27/14 1637 04/27/14 1736 04/27/14 1819  BP: 147/82 155/82 163/88 145/82  Pulse: 85 86 87 88  Temp:  98.2 F (36.8 C)    TempSrc:  Oral    Resp:  18  18  Height:      Weight:      SpO2:         FHT: Category 1 UC:   irregular, every 3-5 minutes SVE:   Dilation: 3.5 Effacement (%): 80 Station: -1 Exam by:: Manfred Arch, CNM  PCR 0.20  Assessment:  IUP at 39 5/7 weeks Early labor Gestational hypertension GBS negative  Plan: Recommended pitocin augmentation. Patient also elects to proceed with epidural.  Nigel Bridgeman CNM 04/27/2014, 6:37 PM

## 2014-04-27 NOTE — MAU Note (Signed)
Patient stats she is having contractions every 7 minutes with bloody show. Reports good fetal movement.

## 2014-04-27 NOTE — H&P (Signed)
Miarose Kasa is a 24 y.o. female, G1P0 at 14 5/7 weeks, presenting for UCs since 10:30am.  Denies leaking, bleeding, HA, visual sx, or epigastric pain.  Reports +FM.  Cervix was 1 cm on last exam 04/18/14.  Patient Active Problem List   Diagnosis Date Noted  . Morbid obesity 04/27/2014  . Gestational hypertension 04/27/2014    History of present pregnancy: Patient entered care at 9 1/7 weeks.   EDC of 04/29/13 was established by LMP and in agreement with [place] at 9 1/7 weeks.   Anatomy scan:  20 2/7 weeks, with limited anatomy findings and a low-lying placenta, growth at 86%ile, normal fluid.   Additional [place] evaluations:   27 2/7 weeks:  Still limited cardiac views of aorta, normal cervical length and fluid.  Placenta posterior. 35 weeks:  EFW 5+13, 74%ile, normal fluid, vtx, posterior placenta Significant prenatal events:   Had 1st trimester bleeding, with low-lying placenta noted on 20 week [place].  1st trimester screen WNL.  Declined TDAP and flu.  Early glucola WNL, later glucola 135, with normal 3 hour GTT.  Had BPPs WNL from 32 weeks.  BP range during pregnancy 110-130s/78-90.  Had PIH w/u 12/23 with normal labs. Last evaluation:  04/23/14, BP 134/88,  BPP 8/8, AFI WNL, vtx.  OB History    Gravida Para Term Preterm AB TAB SAB Ectopic Multiple Living   1         0     Past Medical History  Diagnosis Date  . Allergy   . Medical history non-contributory    Past Surgical History  Procedure Laterality Date  . Breast surgery      2008   Family History: family history includes Diabetes in her father and paternal grandfather; Hypertension in her maternal grandmother and mother.   Social History:  reports that she has never smoked. She does not have any smokeless tobacco history on file. She reports that she drinks about 1.5 oz of alcohol per week. She reports that she does not use illicit drugs.  Patient is African American, of the [place] faith, college-educated, employed as Press photographer.  FOB is present with her and involved and supportive.   Prenatal Transfer Tool  Maternal Diabetes: No Genetic Screening: Normal 1st trimester screen Maternal Ultrasounds/Referrals: Normal Fetal Ultrasounds or other Referrals:  None Maternal Substance Abuse:  No Significant Maternal Medications:  None Significant Maternal Lab Results: Lab values include: Group B Strep negative  TDAP declined Flu declined  ROS:  UCs, +FM  No Known Allergies   Dilation: 3 Effacement (%): 80 Exam by:: Manfred Arch, CNM Blood pressure 146/84, pulse 83, temperature 97.6 F (36.4 C), temperature source Oral, resp. rate 20, height 5' 7.5" (1.715 m), weight 305 lb 9.6 oz (138.619 kg), SpO2 100 %.  Initial BPs in MAU: 144/104, 166/108, 174/106, 174/108  Chest clear Heart RRR without murmur Abd gravid, NT, FH 42 cm Pelvic: Cervix 3 cm, 80%, vtx, -1, soft Ext: DTR 2+, no clonus, 1+ edema.  FHR: Category 2--moderate variability, occasional mild variables, non-reactive at present UCs:  q 5 min mild.  Prenatal labs: ABO, Rh:  B+ Antibody:  Neg Rubella:   Immune RPR:   NR HBsAg:   Neg HIV:   NR GBS: Negative (12/23 0000) Sickle cell/Hgb electrophoresis:  AA Pap:  06/25/13 WNL GC:  Negative 08/28/13 Chlamydia:  Negative 08/28/13 Genetic screenings:  Normal 1st trimester screen Glucola:  Early glucola WNL,; 135 at 28 weeks, normal 3 hour GTT  Other:   Hgb 11.9 at NOB, 10.8 at 28 weeks, 11.9 at 37 weeks PIH labs and PCR WNL 04/18/14   Assessment/Plan: IUP at 39 5/7 weeks Early labor Gestational hypertension vs pre-eclampsia Morbid obesity  Plan: Admit to [facility] per consult with Dr. Dion Body  Routine [facility] orders Pain med/epidural prn PIH labs and PCR Labetalol IV prn for BP parameters Augmentation if no change in cervix in next 2-4 hours.  Shirleyann Montero, [place], [place] 04/27/2014, 4:06 PM

## 2014-04-27 NOTE — Progress Notes (Signed)
  Subjective: C/O itching, o/w comfortable w/ epidural. Declines Benadryl.  Objective: BP 140/68 mmHg  Pulse 91  Temp(Src) 98.6 F (37 C) (Oral)  Resp 18  Ht 5' 7.5" (1.715 m)  Wt 305 lb 9.6 oz (138.619 kg)  BMI 47.13 kg/m2  SpO2 100%      FHT: BL 130 w/ moderate variability, +accels, intermittent mild variables, earlys UC:   irregular, every 2-3 minutes SVE: 4/80/-1   Pitocin at 6 mius/min AROM'd - small amount of thick mec Full internals placed  Assessment:  IUP at 39.5 wks admitted in early labor GHTN GBS neg   Plan: Begin Amnioinfusion Continue w/ current management plan Observe tracing closely Consult as indicated   Sherre Scarlet CNM 04/27/2014, 10:54 PM

## 2014-04-27 NOTE — Anesthesia Preprocedure Evaluation (Signed)
Anesthesia Evaluation  Patient identified by MRN, date of birth, ID band Patient awake    Reviewed: Allergy & Precautions, H&P , NPO status , Patient's Chart, lab work & pertinent test results  Airway Mallampati: III TM Distance: >3 FB Neck ROM: full    Dental no notable dental hx.    Pulmonary neg pulmonary ROS,    Pulmonary exam normal       Cardiovascular negative cardio ROS      Neuro/Psych negative neurological ROS  negative psych ROS   GI/Hepatic negative GI ROS, Neg liver ROS,   Endo/Other  Morbid obesity  Renal/GU negative Renal ROS     Musculoskeletal   Abdominal (+) + obese,   Peds  Hematology negative hematology ROS (+)   Anesthesia Other Findings   Reproductive/Obstetrics (+) Pregnancy                           Anesthesia Physical Anesthesia Plan  ASA: III  Anesthesia Plan: Epidural   Post-op Pain Management:    Induction:   Airway Management Planned:   Additional Equipment:   Intra-op Plan:   Post-operative Plan:   Informed Consent: I have reviewed the patients History and Physical, chart, labs and discussed the procedure including the risks, benefits and alternatives for the proposed anesthesia with the patient or authorized representative who has indicated his/her understanding and acceptance.     Plan Discussed with:   Anesthesia Plan Comments:         Anesthesia Quick Evaluation  

## 2014-04-28 ENCOUNTER — Encounter (HOSPITAL_COMMUNITY): Payer: Self-pay | Admitting: *Deleted

## 2014-04-28 MED ORDER — TETANUS-DIPHTH-ACELL PERTUSSIS 5-2.5-18.5 LF-MCG/0.5 IM SUSP: 0.5000 mL | Freq: Once | INTRAMUSCULAR | Status: DC

## 2014-04-28 MED ORDER — LANOLIN HYDROUS EX OINT: TOPICAL_OINTMENT | CUTANEOUS | Status: DC | PRN

## 2014-04-28 MED ORDER — WITCH HAZEL-GLYCERIN EX PADS: 1.0000 "application " | MEDICATED_PAD | CUTANEOUS | Status: DC | PRN

## 2014-04-28 MED ORDER — ZOLPIDEM TARTRATE 5 MG PO TABS: 5.0000 mg | ORAL_TABLET | Freq: Every evening | ORAL | Status: DC | PRN

## 2014-04-28 MED ORDER — ONDANSETRON HCL 4 MG/2ML IJ SOLN: 4.0000 mg | INTRAMUSCULAR | Status: DC | PRN

## 2014-04-28 MED ORDER — DIPHENHYDRAMINE HCL 25 MG PO CAPS: 25.0000 mg | ORAL_CAPSULE | Freq: Four times a day (QID) | ORAL | Status: DC | PRN

## 2014-04-28 MED ORDER — IBUPROFEN 600 MG PO TABS
600.0000 mg | ORAL_TABLET | Freq: Four times a day (QID) | ORAL | Status: DC
  Administered 2014-04-28 – 2014-04-30 (×8): 600 mg via ORAL
  Filled 2014-04-28 (×8): qty 1

## 2014-04-28 MED ORDER — FERROUS SULFATE 325 (65 FE) MG PO TABS
325.0000 mg | ORAL_TABLET | Freq: Two times a day (BID) | ORAL | Status: DC
  Administered 2014-04-28 – 2014-04-29 (×3): 325 mg via ORAL
  Filled 2014-04-28 (×3): qty 1

## 2014-04-28 MED ORDER — SENNOSIDES-DOCUSATE SODIUM 8.6-50 MG PO TABS
2.0000 | ORAL_TABLET | ORAL | Status: DC
  Administered 2014-04-28 – 2014-04-29 (×2): 2 via ORAL
  Filled 2014-04-28 (×2): qty 2

## 2014-04-28 MED ORDER — OXYCODONE-ACETAMINOPHEN 5-325 MG PO TABS: 1.0000 | ORAL_TABLET | ORAL | Status: DC | PRN

## 2014-04-28 MED ORDER — ONDANSETRON HCL 4 MG PO TABS: 4.0000 mg | ORAL_TABLET | ORAL | Status: DC | PRN

## 2014-04-28 MED ORDER — DIBUCAINE 1 % RE OINT: 1.0000 "application " | TOPICAL_OINTMENT | RECTAL | Status: DC | PRN

## 2014-04-28 MED ORDER — SIMETHICONE 80 MG PO CHEW: 80.0000 mg | CHEWABLE_TABLET | ORAL | Status: DC | PRN

## 2014-04-28 MED ORDER — PRENATAL MULTIVITAMIN CH
1.0000 | ORAL_TABLET | Freq: Every day | ORAL | Status: DC
  Administered 2014-04-28 – 2014-04-29 (×2): 1 via ORAL
  Filled 2014-04-28 (×2): qty 1

## 2014-04-28 MED ORDER — BENZOCAINE-MENTHOL 20-0.5 % EX AERO: 1.0000 "application " | INHALATION_SPRAY | CUTANEOUS | Status: DC | PRN

## 2014-04-28 MED ORDER — OXYCODONE-ACETAMINOPHEN 5-325 MG PO TABS: 2.0000 | ORAL_TABLET | ORAL | Status: DC | PRN

## 2014-04-28 NOTE — Progress Notes (Signed)
  Subjective: Still having constant urge to push, especially when on side. FOB at bedside.  Objective: BP 129/67 mmHg  Pulse 87  Temp(Src) 99.1 F (37.3 C) (Oral)  Resp 18  Ht 5' 7.5" (1.715 m)  Wt 305 lb 9.6 oz (138.619 kg)  BMI 47.13 kg/m2  SpO2 97%     FHT: Intermittent moderate variables, however moderate variability throughout first and second stage UC:   irregular, every 2-3 minutes SVE:   Dilation: 10 Effacement (%): 100 Station: +2 Exam by:: K. Charleigh Correnti Pitocin off  Assessment:  2nd stage labor GBS neg  Plan: Commence w/ pushing Place 02 Expect SVD briefly  Sherre Scarlet CNM 04/28/2014, 4:53 AM

## 2014-04-28 NOTE — Progress Notes (Signed)
  Subjective: Feeling pressure, but o/w comfortable w/ epidural. Was recently given an epidural bolus by RN.  Objective: BP 136/82 mmHg  Pulse 94  Temp(Src) 99.1 F (37.3 C) (Oral)  Resp 18  Ht 5' 7.5" (1.715 m)  Wt 305 lb 9.6 oz (138.619 kg)  BMI 47.13 kg/m2  SpO2 100%      Today's Vitals   04/27/14 2302 04/27/14 2332 04/28/14 0002 04/28/14 0033  BP: 142/82 138/79 127/83 136/82  Pulse: 89 98 98 94  Temp:   99.1 F (37.3 C)   TempSrc:   Oral   Resp: Height:      Weight:      SpO2:      PainSc:       FHT: FSE inadvertently disconnected after cervical exam/repositioning - replaced at 12:48 AM. BL 130 w/ min variability, intermittent variables, earlys UC:   regular, every 2 minutes, adequate MVUs since IUPC placed (200+) SVE: 4-5/80/-1    Pitocin at 6 mius/min  Assessment:  IUP at 39.6 wks GBS neg Early labor Cat 2 FHRT  Plan: Due to intermittent variables, will discontinue Pitocin x 1 hr then restart at 1 miu/min, increasing by 1 Continue amnioinfusion Position change Consult prn   Sherre Scarlet CNM 04/28/2014, 12:57 AM

## 2014-04-28 NOTE — Anesthesia Postprocedure Evaluation (Signed)
  Anesthesia Post-op Note  Patient: Tax inspector  Procedure(s) Performed: * No procedures listed *  Patient Location: Mother/Baby  Anesthesia Type:Epidural  Level of Consciousness: awake and alert   Airway and Oxygen Therapy: Patient Spontanous Breathing  Post-op Pain: mild  Post-op Assessment: Post-op Vital signs reviewed, Patient's Cardiovascular Status Stable, Respiratory Function Stable, No signs of Nausea or vomiting, Pain level controlled, No headache, No residual numbness and No residual motor weakness  Post-op Vital Signs: Reviewed  Last Vitals:  Filed Vitals:   04/28/14 1300  BP: 139/79  Pulse: 80  Temp: 36.8 C  Resp: 18    Complications: No apparent anesthesia complications

## 2014-04-28 NOTE — Lactation Note (Signed)
This note was copied from the chart of Sarah Giles. Lactation Consultation Note  Patient Name: Sarah Sheryle Vice [id number] Date: 04/28/2014 Reason for consult: Initial assessment;Difficult latch at this feeding, although baby had latched for several feedings previously.  LATCH score per RN assessment=6 due to "flat" nipples but LC assesses that nipples are short-shaft but do evert.  Mom is a primipara.  Baby is now fussy and unable to latch.  Mom has large/soft/compressible breasts and short-shaft nipples.  She prefers cradle position but is willing to start with cross-cradle and with a #24 NS, baby is able to latch with wide areolar grasp and rhythmical sucking bursts. Some swallows noted during first 10 minutes and FOB shown how to assist.  Application and cleaning of NS reviewed with parents.  STS and cue feeding recommended.  RN, Fannie Knee arrives during feeding and is informed of use of NS. Mom encouraged to feed baby 8-12 times/24 hours and with feeding cues. LC encouraged review of Baby and Me pp 9, 14 and 20-25 for STS and BF information. LC provided [employer] brochure and reviewed [facility] services and list of community and web site resources.    Maternal Data Formula Feeding for Exclusion: No Has patient been taught Hand Expression?: Yes (LC demonstrated) Does the patient have breastfeeding experience prior to this delivery?: No  Feeding Feeding Type: Breast Fed  LATCH Score/Interventions Latch: Grasps breast easily, tongue down, lips flanged, rhythmical sucking. (unable to grasp areola without NS in place; w/NS, latches well)  Audible Swallowing: A few with stimulation Intervention(s): Skin to skin;Hand expression  Type of Nipple: Everted at rest and after stimulation (short-shaft but evert (nipples are wide but soft))  Comfort (Breast/Nipple): Soft / non-tender     Hold (Positioning): Assistance needed to correctly position infant at breast and maintain latch. Intervention(s):  Breastfeeding basics reviewed;Support Pillows;Position options;Skin to skin  LATCH Score: 8 (LC assisted and observed, using a #24 NS)  Lactation Tools Discussed/Used Tools: Nipple Shields Nipple shield size: 24  (use, cleaning and hand-out) STS, cue feedings, hand expression   Consult Status Consult Status: Follow-up Date: 04/29/14 Follow-up type: In-patient    Warrick Parisian [facility] 04/28/2014, 8:59 PM

## 2014-04-29 LAB — COMPREHENSIVE METABOLIC PANEL
ALT: 16 U/L (ref 0–35)
ANION GAP: 7 (ref 5–15)
AST: 22 U/L (ref 0–37)
Albumin: 2.7 g/dL — ABNORMAL LOW (ref 3.5–5.2)
Alkaline Phosphatase: 89 U/L (ref 39–117)
BILIRUBIN TOTAL: 0.2 mg/dL — AB (ref 0.3–1.2)
BUN: 7 mg/dL (ref 6–23)
CO2: 21 mmol/L (ref 19–32)
CREATININE: 0.76 mg/dL (ref 0.50–1.10)
Calcium: 8.6 mg/dL (ref 8.4–10.5)
Chloride: 111 mEq/L (ref 96–112)
Glucose, Bld: 99 mg/dL (ref 70–99)
POTASSIUM: 3 mmol/L — AB (ref 3.5–5.1)
Sodium: 139 mmol/L (ref 135–145)
Total Protein: 5.8 g/dL — ABNORMAL LOW (ref 6.0–8.3)

## 2014-04-29 LAB — CBC WITH DIFFERENTIAL/PLATELET
Basophils Absolute: 0 10*3/uL (ref 0.0–0.1)
Basophils Relative: 0 % (ref 0–1)
Eosinophils Absolute: 0.4 10*3/uL (ref 0.0–0.7)
Eosinophils Relative: 3 % (ref 0–5)
HCT: 30.8 % — ABNORMAL LOW (ref 36.0–46.0)
HEMOGLOBIN: 9.9 g/dL — AB (ref 12.0–15.0)
Lymphocytes Relative: 17 % (ref 12–46)
Lymphs Abs: 2.7 10*3/uL (ref 0.7–4.0)
MCH: 25.6 pg — AB (ref 26.0–34.0)
MCHC: 32.1 g/dL (ref 30.0–36.0)
MCV: 79.6 fL (ref 78.0–100.0)
MONO ABS: 0.7 10*3/uL (ref 0.1–1.0)
MONOS PCT: 5 % (ref 3–12)
NEUTROS PCT: 75 % (ref 43–77)
Neutro Abs: 11.6 10*3/uL — ABNORMAL HIGH (ref 1.7–7.7)
Platelets: 237 10*3/uL (ref 150–400)
RBC: 3.87 MIL/uL (ref 3.87–5.11)
RDW: 15.5 % (ref 11.5–15.5)
WBC: 15.4 10*3/uL — AB (ref 4.0–10.5)

## 2014-04-29 LAB — CBC
HCT: 30.8 % — ABNORMAL LOW (ref 36.0–46.0)
HEMOGLOBIN: 10.1 g/dL — AB (ref 12.0–15.0)
MCH: 26.2 pg (ref 26.0–34.0)
MCHC: 32.8 g/dL (ref 30.0–36.0)
MCV: 79.8 fL (ref 78.0–100.0)
PLATELETS: 222 10*3/uL (ref 150–400)
RBC: 3.86 MIL/uL — ABNORMAL LOW (ref 3.87–5.11)
RDW: 15.3 % (ref 11.5–15.5)
WBC: 17.4 10*3/uL — AB (ref 4.0–10.5)

## 2014-04-29 LAB — HIV ANTIBODY (ROUTINE TESTING W REFLEX): HIV 1&2 Ab, 4th Generation: NONREACTIVE

## 2014-04-29 LAB — RPR

## 2014-04-29 LAB — PROTEIN / CREATININE RATIO, URINE
CREATININE, URINE: 155 mg/dL
Protein Creatinine Ratio: 0.25 — ABNORMAL HIGH (ref 0.00–0.15)
Total Protein, Urine: 38 mg/dL

## 2014-04-29 LAB — URIC ACID: Uric Acid, Serum: 5 mg/dL (ref 2.4–7.0)

## 2014-04-29 LAB — LACTATE DEHYDROGENASE: LDH: 157 U/L (ref 94–250)

## 2014-04-29 MED ORDER — LABETALOL HCL 5 MG/ML IV SOLN: 10.0000 mg | INTRAVENOUS | Status: DC | PRN

## 2014-04-29 NOTE — Progress Notes (Addendum)
Rn called Midwife regarding BP of 154/81. Orders were given to take BP again in two hours. Midwife said to call if bp is 160/110

## 2014-04-29 NOTE — Progress Notes (Signed)
Sarah Giles   Subjective: Post Partum Day 1 Vaginal delivery, small vaginal laceration Patient up ad lib, denies syncope or dizziness. Reports consuming regular diet without issues and denies N/V No issues with urination and reports bleeding is appropriate  Feeding:  breast Contraceptive plan:   OP  Objective: Temp:  [98.3 F (36.8 C)-98.8 F (37.1 C)] 98.4 F (36.9 C) (01/03 0614) Pulse Rate:  [80-94] 85 (01/03 0614) Resp:  [18] 18 (01/03 0614) BP: (134-154)/(76-86) 134/76 mmHg (01/03 8295)  Physical Exam:  General: alert and cooperative Ext: WNL, no edema. No evidence of DVT seen on physical exam. Breast: Soft filling Lungs: CTAB Heart RRR without murmur  Abdomen:  Soft, fundus firm, lochia scant, + bowel sounds, non distended, non tender Lochia: appropriate Uterine Fundus: firm Laceration: healing well    Recent Labs  04/27/14 1540 04/29/14 0637  HGB 11.5* 10.1*  HCT 35.4* 30.8*    Assessment S/P Vaginal Delivery-Day 1 Stable  Normal Involution Breastfeeding Circumcision: OP  Plan: Continue current care Plan for discharge tomorrow, Breastfeeding and Lactation consult Lactation support   Trinady Milewski, CNM, MSN 04/29/2014, 11:02 AM

## 2014-04-29 NOTE — Progress Notes (Signed)
Addendum Results for orders placed or performed during the hospital encounter of 04/27/14 (from the past 24 hour(s))  CBC     Status: Abnormal   Collection Time: 04/29/14  6:37 AM  Result Value Ref Range   WBC 17.4 (H) 4.0 - 10.5 K/uL   RBC 3.86 (L) 3.87 - 5.11 MIL/uL   Hemoglobin 10.1 (L) 12.0 - 15.0 g/dL   HCT 69.6 (L) 29.5 - 28.4 %   MCV 79.8 78.0 - 100.0 fL   MCH 26.2 26.0 - 34.0 pg   MCHC 32.8 30.0 - 36.0 g/dL   RDW 13.2 44.0 - 10.2 %   Platelets 222 150 - 400 K/uL  Comprehensive metabolic panel     Status: Abnormal   Collection Time: 04/29/14  4:29 PM  Result Value Ref Range   Sodium 139 135 - 145 mmol/L   Potassium 3.0 (L) 3.5 - 5.1 mmol/L   Chloride 111 96 - 112 mEq/L   CO2 21 19 - 32 mmol/L   Glucose, Bld 99 70 - 99 mg/dL   BUN 7 6 - 23 mg/dL   Creatinine, Ser 7.25 0.50 - 1.10 mg/dL   Calcium 8.6 8.4 - 36.6 mg/dL   Total Protein 5.8 (L) 6.0 - 8.3 g/dL   Albumin 2.7 (L) 3.5 - 5.2 g/dL   AST 22 0 - 37 U/L   ALT 16 0 - 35 U/L   Alkaline Phosphatase 89 39 - 117 U/L   Total Bilirubin 0.2 (L) 0.3 - 1.2 mg/dL   GFR calc non Af Amer >90 >90 mL/min   GFR calc Af Amer >90 >90 mL/min   Anion gap 7 5 - 15  CBC with Differential     Status: Abnormal   Collection Time: 04/29/14  4:29 PM  Result Value Ref Range   WBC 15.4 (H) 4.0 - 10.5 K/uL   RBC 3.87 3.87 - 5.11 MIL/uL   Hemoglobin 9.9 (L) 12.0 - 15.0 g/dL   HCT 44.0 (L) 34.7 - 42.5 %   MCV 79.6 78.0 - 100.0 fL   MCH 25.6 (L) 26.0 - 34.0 pg   MCHC 32.1 30.0 - 36.0 g/dL   RDW 95.6 38.7 - 56.4 %   Platelets 237 150 - 400 K/uL   Neutrophils Relative % 75 43 - 77 %   Neutro Abs 11.6 (H) 1.7 - 7.7 K/uL   Lymphocytes Relative 17 12 - 46 %   Lymphs Abs 2.7 0.7 - 4.0 K/uL   Monocytes Relative 5 3 - 12 %   Monocytes Absolute 0.7 0.1 - 1.0 K/uL   Eosinophils Relative 3 0 - 5 %   Eosinophils Absolute 0.4 0.0 - 0.7 K/uL   Basophils Relative 0 0 - 1 %   Basophils Absolute 0.0 0.0 - 0.1 K/uL  Lactate dehydrogenase     Status:  None   Collection Time: 04/29/14  4:29 PM  Result Value Ref Range   LDH 157 94 - 250 U/L  Uric acid     Status: None   Collection Time: 04/29/14  4:29 PM  Result Value Ref Range   Uric Acid, Serum 5.0 2.4 - 7.0 mg/dL  Protein / creatinine ratio, urine     Status: Abnormal   Collection Time: 04/29/14  5:40 PM  Result Value Ref Range   Creatinine, Urine 155.00 mg/dL   Total Protein, Urine 38 mg/dL   Protein Creatinine Ratio 0.25 (H) 0.00 - 0.15   Filed Vitals:   04/29/14 1153 04/29/14 1740 04/29/14 2100  04/29/14 2158  BP: 140/80 141/92 148/89 130/68  Pulse: 98 101  94  Temp:  98.4 F (36.9 C)    TempSrc:  Oral    Resp:  18  16  Height:      Weight:      SpO2:    100%   Plan to con't to monitor Parameters for hypertension Dr Idamae Schuller informed

## 2014-04-29 NOTE — Progress Notes (Signed)
Reviewed CNM note and agree. Mild elevations in BP, asymptomatic.  Pt appears comfortable. Reflexes not illicited.   LE edema normal.  A: Postpartum SVD R/o preeclampsia.  Awaiting pt to void to send PCR.  P: VS q 6 hours F/u PCR. If normal, discontinue IV. POC discussed CNM Venus Standard.

## 2014-04-30 MED ORDER — IBUPROFEN 600 MG PO TABS: 600.0000 mg | ORAL_TABLET | Freq: Four times a day (QID) | ORAL | Status: DC | PRN

## 2014-04-30 MED ORDER — FERROUS SULFATE 325 (65 FE) MG PO TABS: 325.0000 mg | ORAL_TABLET | Freq: Every day | ORAL | Status: DC

## 2014-04-30 NOTE — Discharge Summary (Signed)
Vaginal Delivery Discharge Summary  Sarah Giles  DOB:    28-Feb-1991 MRN:    [id number] CSN:    [id number]  Date of admission:                  04/27/14  Date of discharge:                   04/30/14  Procedures this admission:   SVB  Date of Delivery: 04/28/14  Newborn Data:  Live born female  Birth Weight: 8 lb 5.5 oz (3785 g) APGAR: 8, 9  Home with mother. Name: Sarah Giles Circumcision Plan: Outpatient  History of Present Illness:  Ms. Sarah Giles is a 24 y.o. female, G1P1001, who presents at [redacted]w[redacted]d weeks gestation. The patient has been followed at the [facility] division of [facility]. She was admitted onset of labor and elevated BP. Her pregnancy has been complicated by:  Patient Active Problem List   Diagnosis Date Noted  . NSVD (normal spontaneous vaginal delivery) 04/28/2014  . Morbid obesity 04/27/2014  . Gestational hypertension 04/27/2014  Baseline BP during pregnancy 130s/80s, with negative PIH w/u at 37 weeks.   Hospital Course:  Admitted 04/27/14 in early labor with elevated BP. Negative GBS. Progressed with pitocin augmentation.  PIH labs and PCR negative.  Utilized epidural for pain management.  Patient never required treatment of BP, due to never meeting treatment parameters.  Delivery was performed by Sarah Giles, CNM, without complication. Patient and baby tolerated the procedure without difficulty, with small vaginal laceration noted. Infant status was stable and remained in room with mother.  Mother and infant then had an uncomplicated postpartum course--initially breastfeeding went well, but then baby had less wet diapers and the patient began supplementing.  She had hx of breast reduction at age 44.  Mom's physical exam was WNL on day 2, with no swelling, HA, visual sx, or epigastric pain, and she was discharged home in stable condition. Contraception plan was Nexplanon or Nuvaring.  Patient will continue to determine  if she plans to breastfeed/pump at all, and her contraceptive choice will be determined by this issue.  She received adequate benefit from po pain medications and was d/c'd home on Motrin.  She will also continue daily Fe, due to Hgb of 9.9 on day 1 (down from 11.5 on admission). [facility] RN will see patient later this week for BP check and will contact [facility] with readings.     Feeding:  breast and bottle  Contraception:  Plans Nexplanon or Nuvaring (previous user)  Discharge hemoglobin:  HEMOGLOBIN  Date Value Ref Range Status  04/29/2014 9.9* 12.0 - 15.0 g/dL Final  08/65/7846 96.2* 12.2 - 16.2 g/dL Final   HCT  Date Value Ref Range Status  04/29/2014 30.8* 36.0 - 46.0 % Final   HCT, POC  Date Value Ref Range Status  02/16/2013 35.4* 37.7 - 47.9 % Final    Discharge Physical Exam:   General: alert Lochia: appropriate Uterine Fundus: firm Incision: healing well DVT Evaluation: No evidence of DVT seen on physical exam, no edema.  DTR 2+, no clonus. Negative Homan's sign.  Intrapartum Procedures: spontaneous vaginal delivery Postpartum Procedures: none Complications-Operative and Postpartum:  Mild anemia Discharge Diagnoses: Term Pregnancy-delivered and anemia without hemodynamic instability, gestational hypertension vs chronic hypertension.  Discharge Information:  Activity:           pelvic rest Diet:  routine Medications: Ibuprofen and Iron Condition:      stable Instructions:     Discharge to: home  Follow-up Information    Follow up with [facility]. Schedule an appointment as soon as possible for a visit in 5 weeks.   Specialty:  Obstetrics and Gynecology   Why:  Call for any questions or concerns.  Call [facility] to schedule outpatient circumcision.   Contact information:   [address] [place] [address] [phone]       Sarah Giles [facility] 04/30/2014 8:24 AM

## 2014-04-30 NOTE — Progress Notes (Signed)
Ur chart review completed.  

## 2014-04-30 NOTE — Discharge Instructions (Signed)
Hypertension During Pregnancy °Hypertension, or high blood pressure, is when there is extra pressure inside your blood vessels that carry blood from the heart to the rest of your body (arteries). It can happen at any time in life, including pregnancy. Hypertension during pregnancy can cause problems for you and your baby. Your baby might not weigh as much as he or she should at birth or might be born early (premature). Very bad cases of hypertension during pregnancy can be life-threatening.  °Different types of hypertension can occur during pregnancy. These include: °· Chronic hypertension. This happens when a woman has hypertension before pregnancy and it continues during pregnancy. °· Gestational hypertension. This is when hypertension develops during pregnancy. °· Preeclampsia or toxemia of pregnancy. This is a very serious type of hypertension that develops only during pregnancy. It affects the whole body and can be very dangerous for both mother and baby.   °Gestational hypertension and preeclampsia usually go away after your baby is born. Your blood pressure will likely stabilize within 6 weeks. Women who have hypertension during pregnancy have a greater chance of developing hypertension later in life or with future pregnancies. °RISK FACTORS °There are certain factors that make it more likely for you to develop hypertension during pregnancy. These include: °· Having hypertension before pregnancy. °· Having hypertension during a previous pregnancy. °· Being overweight. °· Being older than 40 years. °· Being pregnant with more than one baby. °· Having diabetes or kidney problems. °SIGNS AND SYMPTOMS °Chronic and gestational hypertension rarely cause symptoms. Preeclampsia has symptoms, which may include: °· Increased protein in your urine. Your health care provider will check for this at every prenatal visit. °· Swelling of your hands and face. °· Rapid weight gain. °· Headaches. °· Visual changes. °· Being  bothered by light. °· Abdominal pain, especially in the upper right area. °· Chest pain. °· Shortness of breath. °· Increased reflexes. °· Seizures. These occur with a more severe form of preeclampsia, called eclampsia. °DIAGNOSIS  °You may be diagnosed with hypertension during a regular prenatal exam. At each prenatal visit, you may have: °· Your blood pressure checked. °· A urine test to check for protein in your urine. °The type of hypertension you are diagnosed with depends on when you developed it. It also depends on your specific blood pressure reading. °· Developing hypertension before 20 weeks of pregnancy is consistent with chronic hypertension. °· Developing hypertension after 20 weeks of pregnancy is consistent with gestational hypertension. °· Hypertension with increased urinary protein is diagnosed as preeclampsia. °· Blood pressure measurements that stay above 160 systolic or 110 diastolic are a sign of severe preeclampsia. °TREATMENT °Treatment for hypertension during pregnancy varies. Treatment depends on the type of hypertension and how serious it is. °· If you take medicine for chronic hypertension, you may need to switch medicines. °¨ Medicines called ACE inhibitors should not be taken during pregnancy. °¨ Low-dose aspirin may be suggested for women who have risk factors for preeclampsia. °· If you have gestational hypertension, you may need to take a blood pressure medicine that is safe during pregnancy. Your health care provider will recommend the correct medicine. °· If you have severe preeclampsia, you may need to be in the hospital. Health care providers will watch you and your baby very closely. You also may need to take medicine called magnesium sulfate to prevent seizures and lower blood pressure. °· Sometimes, an early delivery is needed. This may be the case if the condition worsens. It would be   done to protect you and your baby. The only cure for preeclampsia is delivery.  Your health  care provider may recommend that you take one low-dose aspirin (81 mg) each day to help prevent high blood pressure during your pregnancy if you are at risk for preeclampsia. You may be at risk for preeclampsia if:  You had preeclampsia or eclampsia during a previous pregnancy.  Your baby did not grow as expected during a previous pregnancy.  You experienced preterm birth with a previous pregnancy.  You experienced a separation of the placenta from the uterus (placental abruption) during a previous pregnancy.  You experienced the loss of your baby during a previous pregnancy.  You are pregnant with more than one baby.  You have other medical conditions, such as diabetes or an autoimmune disease. HOME CARE INSTRUCTIONS  Schedule and keep all of your regular prenatal care appointments. This is important.  Take medicines only as directed by your health care provider. Tell your health care provider about all medicines you take.  Eat as little salt as possible.  Get regular exercise.  Do not drink alcohol.  Do not use tobacco products.  Do not drink products with caffeine.  Lie on your left side when resting. SEEK IMMEDIATE MEDICAL CARE IF:  You have severe abdominal pain.  You have sudden swelling in your hands, ankles, or face.  You gain 4 pounds (1.8 kg) or more in 1 week.  You vomit repeatedly.  You have vaginal bleeding.  You have a severe headache.  You have blurred or double vision.  You have muscle twitching or spasms.  You have shortness of breath.  You have blue fingernails or lips.  You have blood in your urine. MAKE SURE YOU:  Understand these instructions.  Will watch your condition.  Will get help right away if you are not doing well or get worse. Document Released: 12/30/2010 Document Revised: 08/28/2013 Document Reviewed: 11/10/2012 [facility] Patient Information 2015 [place], [place]. This information is not intended to replace advice given to  you by your health care provider. Make sure you discuss any questions you have with your health care provider.   Postpartum Care After Vaginal Delivery After you deliver your newborn (postpartum period), the usual stay in the hospital is 24-72 hours. If there were problems with your labor or delivery, or if you have other medical problems, you might be in the hospital longer.  While you are in the hospital, you will receive help and instructions on how to care for yourself and your newborn during the postpartum period.  While you are in the hospital:  Be sure to tell your nurses if you have pain or discomfort, as well as where you feel the pain and what makes the pain worse.  If you had an incision made near your vagina (episiotomy) or if you had some tearing during delivery, the nurses may put ice packs on your episiotomy or tear. The ice packs may help to reduce the pain and swelling.  If you are breastfeeding, you may feel uncomfortable contractions of your uterus for a couple of weeks. This is normal. The contractions help your uterus get back to normal size.  It is normal to have some bleeding after delivery.  For the first 1-3 days after delivery, the flow is red and the amount may be similar to a period.  It is common for the flow to start and stop.  In the first few days, you may pass some small clots.  Let your nurses know if you begin to pass large clots or your flow increases.  Do not  flush blood clots down the toilet before having the nurse look at them.  During the next 3-10 days after delivery, your flow should become more watery and pink or brown-tinged in color.  Ten to fourteen days after delivery, your flow should be a small amount of yellowish-white discharge.  The amount of your flow will decrease over the first few weeks after delivery. Your flow may stop in 6-8 weeks. Most women have had their flow stop by 12 weeks after delivery.  You should change your sanitary  pads frequently.  Wash your hands thoroughly with soap and water for at least 20 seconds after changing pads, using the toilet, or before holding or feeding your newborn.  You should feel like you need to empty your bladder within the first 6-8 hours after delivery.  In case you become weak, lightheaded, or faint, call your nurse before you get out of bed for the first time and before you take a shower for the first time.  Within the first few days after delivery, your breasts may begin to feel tender and full. This is called engorgement. Breast tenderness usually goes away within 48-72 hours after engorgement occurs. You may also notice milk leaking from your breasts. If you are not breastfeeding, do not stimulate your breasts. Breast stimulation can make your breasts produce more milk.  Spending as much time as possible with your newborn is very important. During this time, you and your newborn can feel close and get to know each other. Having your newborn stay in your room (rooming in) will help to strengthen the bond with your newborn. It will give you time to get to know your newborn and become comfortable caring for your newborn.  Your hormones change after delivery. Sometimes the hormone changes can temporarily cause you to feel sad or tearful. These feelings should not last more than a few days. If these feelings last longer than that, you should talk to your caregiver.  If desired, talk to your caregiver about methods of family planning or contraception.  Talk to your caregiver about immunizations. Your caregiver may want you to have the following immunizations before leaving the hospital:  Tetanus, diphtheria, and pertussis (Tdap) or tetanus and diphtheria (Td) immunization. It is very important that you and your family (including grandparents) or others caring for your newborn are up-to-date with the Tdap or Td immunizations. The Tdap or Td immunization can help protect your newborn  from getting ill.  Rubella immunization.  Varicella (chickenpox) immunization.  Influenza immunization. You should receive this annual immunization if you did not receive the immunization during your pregnancy. Document Released: 02/08/2007 Document Revised: 01/06/2012 Document Reviewed: 12/09/2011 [facility] Patient Information 2015 [place], [place]. This information is not intended to replace advice given to you by your health care provider. Make sure you discuss any questions you have with your health care provider.

## 2014-04-30 NOTE — Lactation Note (Signed)
This note was copied from the chart of Sarah Giles. Lactation Consultation Note Mom is getting a DEBP and has been instructed to post-pump after every feeding every 3 hours. Mom is to continue to supplement until she is able to hand express colostrum and pump colostrum. Mom had breast reduction and i wasn't able to see any colostrum by hand expression. Mom asked when should she know if she not going to get any milk, I responded she should be seeing some colostrum within 3-5 days. Mom stated she knew it was a possibility that she might not be able to supply BM and would cont. To try. Encouraged to F/U w/[facility] OP services for a pre-post BF weight. Stated she would do that. Patient Name: Sarah Ayesha Markwell [id number] Date: 04/30/2014 Reason for consult: Follow-up assessment   Maternal Data    Feeding    LATCH Score/Interventions                      Lactation Tools Discussed/Used Tools: Pump Breast pump type: Double-Electric Breast Pump Pump Review: Setup, frequency, and cleaning;Milk Storage Initiated by:: RN Date initiated:: 04/29/13   Consult Status Consult Status: Complete Date: 04/30/14 Follow-up type: Call as needed    Vineta Carone G 04/30/2014, 8:00 AM

## 2014-05-02 MED ORDER — FENTANYL 2.5 MCG/ML BUPIVACAINE 1/10 % EPIDURAL INFUSION (WH - ANES)
INTRAMUSCULAR | Status: DC | PRN
  Administered 2014-04-27: 14 mL/h via EPIDURAL

## 2014-05-02 MED ORDER — LIDOCAINE HCL (PF) 1 % IJ SOLN
INTRAMUSCULAR | Status: DC | PRN
  Administered 2014-04-27 (×2): 8 mL

## 2014-05-02 NOTE — Anesthesia Postprocedure Evaluation (Signed)
Anesthesia Post Note  Patient: Sarah Giles  Procedure(s) Performed: * No procedures listed *  Anesthesia type: Epidural  Patient location: Mother/Baby  Post pain: Pain level controlled  Post assessment: Post-op Vital signs reviewed  Last Vitals: There were no vitals filed for this visit.  Post vital signs: Reviewed  Level of consciousness: awake  Complications: No apparent anesthesia complications

## 2014-05-02 NOTE — Anesthesia Procedure Notes (Addendum)
Epidural Patient location during procedure: OB Start time: 04/27/2014 6:53 PM End time: 04/28/2014 5:23 AM  Staffing Anesthesiologist: Leilani AbleHATCHETT, Bhavya Eschete Performed by: anesthesiologist   Preanesthetic Checklist Completed: patient identified, surgical consent, pre-op evaluation, timeout performed, IV checked, risks and benefits discussed and monitors and equipment checked  Epidural Patient position: sitting Prep: site prepped and draped and DuraPrep Patient monitoring: continuous pulse ox and blood pressure Approach: midline Location: L3-L4 Injection technique: LOR air  Needle:  Needle type: Tuohy  Needle gauge: 17 G Needle length: 9 cm and 9 Needle insertion depth: 5 cm cm Catheter type: closed end flexible Catheter size: 19 Gauge Catheter at skin depth: 10 cm Test dose: negative and Other  Assessment Sensory level: T9 Events: blood not aspirated, injection not painful, no injection resistance, negative IV test and no paresthesia  Additional Notes Reason for block:procedure for pain

## 2014-05-09 ENCOUNTER — Encounter (HOSPITAL_COMMUNITY): Payer: Self-pay | Admitting: General Practice

## 2014-05-09 ENCOUNTER — Inpatient Hospital Stay (HOSPITAL_COMMUNITY)
Admission: AD | Admit: 2014-05-09 | Discharge: 2014-05-09 | Disposition: A | Discharge status: Home / Self Care | Payer: Medicaid Other | Source: Ambulatory Visit | Attending: Obstetrics and Gynecology | Admitting: Obstetrics and Gynecology

## 2014-05-09 ENCOUNTER — Other Ambulatory Visit: Payer: Self-pay | Admitting: Obstetrics and Gynecology

## 2014-05-09 DIAGNOSIS — I158 Other secondary hypertension: Secondary | ICD-10-CM | POA: Insufficient documentation

## 2014-05-09 DIAGNOSIS — R030 Elevated blood-pressure reading, without diagnosis of hypertension: Secondary | ICD-10-CM | POA: Diagnosis present

## 2014-05-09 DIAGNOSIS — O9089 Other complications of the puerperium, not elsewhere classified: Secondary | ICD-10-CM | POA: Diagnosis not present

## 2014-05-09 DIAGNOSIS — O139 Gestational [pregnancy-induced] hypertension without significant proteinuria, unspecified trimester: Secondary | ICD-10-CM | POA: Diagnosis present

## 2014-05-09 LAB — COMPREHENSIVE METABOLIC PANEL
ALT: 18 U/L (ref 0–35)
ANION GAP: 14 (ref 5–15)
AST: 20 U/L (ref 0–37)
Albumin: 3.7 g/dL (ref 3.5–5.2)
Alkaline Phosphatase: 89 U/L (ref 39–117)
BUN: 10 mg/dL (ref 6–23)
CHLORIDE: 102 meq/L (ref 96–112)
CO2: 24 mmol/L (ref 19–32)
Calcium: 9.9 mg/dL (ref 8.4–10.5)
Creatinine, Ser: 0.92 mg/dL (ref 0.50–1.10)
GFR calc Af Amer: >90 mL/min (ref >90)
GFR, EST NON AFRICAN AMERICAN: 86 mL/min — AB (ref >90)
Glucose, Bld: 78 mg/dL (ref 70–99)
Potassium: 4.1 mmol/L (ref 3.5–5.1)
Sodium: 140 mmol/L (ref 135–145)
Total Bilirubin: 0.3 mg/dL (ref 0.3–1.2)
Total Protein: 6.8 g/dL (ref 6.0–8.3)

## 2014-05-09 LAB — CBC
HEMATOCRIT: 37.9 % (ref 36.0–46.0)
Hemoglobin: 12 g/dL (ref 12.0–15.0)
MCH: 25.8 pg — ABNORMAL LOW (ref 26.0–34.0)
MCHC: 31.7 g/dL (ref 30.0–36.0)
MCV: 81.3 fL (ref 78.0–100.0)
Platelets: 366 10*3/uL (ref 150–400)
RBC: 4.66 MIL/uL (ref 3.87–5.11)
RDW: 15.5 % (ref 11.5–15.5)
WBC: 10.6 10*3/uL — ABNORMAL HIGH (ref 4.0–10.5)

## 2014-05-09 LAB — PROTEIN / CREATININE RATIO, URINE
CREATININE, URINE: 108 mg/dL
Protein Creatinine Ratio: 0.07 (ref 0.00–0.15)
TOTAL PROTEIN, URINE: 8 mg/dL

## 2014-05-09 LAB — URIC ACID: URIC ACID, SERUM: 6.4 mg/dL (ref 2.4–7.0)

## 2014-05-09 LAB — LACTATE DEHYDROGENASE: LDH: 162 U/L (ref 94–250)

## 2014-05-09 MED ORDER — LABETALOL HCL 100 MG PO TABS
200.0000 mg | ORAL_TABLET | Freq: Once | ORAL | Status: AC
  Administered 2014-05-09: 200 mg via ORAL
  Filled 2014-05-09: qty 2

## 2014-05-09 MED ORDER — LABETALOL HCL 200 MG PO TABS: 200.0000 mg | ORAL_TABLET | Freq: Two times a day (BID) | ORAL | Status: DC

## 2014-05-09 NOTE — MAU Provider Note (Signed)
History   24 yo G1P1 s/p SVB on 04/28/14 presented after [employer] RN reported home BPs of 140s/100.  Patient denies visual sx, HA, or epigastric pain.    Had gestational hypertension from 37 weeks, but did not require BP meds.  Had negative PIH w/u on admission 04/27/14.  Never required IV med for BP parameters during labor or pp.  BPs pp mildly elevated, 130-140s/60-90. Home on day 2, with plan for BP check today by [employer] RN.  Patient bottlefeeding, doing well.  Patient Active Problem List   Diagnosis Date Noted  . NSVD (normal spontaneous vaginal delivery) 04/28/2014  . Morbid obesity 04/27/2014  . Gestational hypertension 04/27/2014    Chief Complaint  Patient presents with  . Hypertension   HPI:  As above  OB History    Gravida Para Term Preterm AB TAB SAB Ectopic Multiple Living   0 1      Past Medical History  Diagnosis Date  . Allergy   . Medical history non-contributory     Past Surgical History  Procedure Laterality Date  . Breast surgery      2008    Family History  Problem Relation Age of Onset  . Hypertension Mother   . Diabetes Father   . Hypertension Maternal Grandmother   . Diabetes Paternal Grandfather     History  Substance Use Topics  . Smoking status: Never Smoker   . Smokeless tobacco: Not on file  . Alcohol Use: 1.5 oz/week    3 Not specified per week    Allergies: No Known Allergies  Prescriptions prior to admission  Medication Sig Dispense Refill Last Dose  . calcium carbonate (TUMS - DOSED IN MG ELEMENTAL CALCIUM) 500 MG chewable tablet Chew 1 tablet by mouth daily as needed for indigestion or heartburn.   Past Week at Unknown time  . ferrous sulfate 325 (65 FE) MG tablet Take 1 tablet (325 mg total) by mouth daily with breakfast. 30 tablet 3 05/08/2014 at Unknown time  . ibuprofen (ADVIL,MOTRIN) 600 MG tablet Take 1 tablet (600 mg total) by mouth every 6 (six) hours as needed. (Patient taking differently: Take 600 mg  by mouth every 6 (six) hours as needed for moderate pain. ) 30 tablet 0 Past Week at Unknown time  . Prenatal Vit-Fe Fumarate-FA (PRENATAL MULTIVITAMIN) TABS tablet Take 1 tablet by mouth daily at 12 noon.   05/08/2014 at Unknown time    ROS:  Small amount vaginal bleeding, mild cramping Physical Exam   Blood pressure 159/106, pulse 76, temperature 98 F (36.7 C), temperature source Oral, resp. rate 18, not currently breastfeeding.   Filed Vitals:   05/09/14 1640 05/09/14 1641 05/09/14 1643 05/09/14 1657  BP: 147/91 147/93 113/87 159/106  Pulse: 86 84 107 76  Temp:      TempSrc:      Resp:      Initial BP 155/105    Physical Exam  In NAD Chest clear Heart RRR without murmur Abd soft, NT, uterus approx 14 week size, NT Pelvic--deferred, minimal lochia Ext DTR 2+, no clonus, 1+ edema   ED Course  Assessment: 12 days s/p SVB Gestational hypertension  Plan: PIH labs PCR Consulted with Dr. Ivar Drape give Labetalol 200 mg po now.   Nigel Bridgeman CNM, MSN 05/09/2014 5:28 PM  Addendum: Received 200 mg Labetalol at 1734  Filed Vitals:   05/09/14 1728 05/09/14 1734 05/09/14 1743 05/09/14 1828  BP: 154/98  154/98 154/93 144/86  Pulse: 80 80 74 76  Temp:      TempSrc:      Resp:    18   Results for orders placed or performed during the hospital encounter of 05/09/14 (from the past 24 hour(s))  CBC     Status: Abnormal   Collection Time: 05/09/14  3:58 PM  Result Value Ref Range   WBC 10.6 (H) 4.0 - 10.5 K/uL   RBC 4.66 3.87 - 5.11 MIL/uL   Hemoglobin 12.0 12.0 - 15.0 g/dL   HCT 16.137.9 09.636.0 - 04.546.0 %   MCV 81.3 78.0 - 100.0 fL   MCH 25.8 (L) 26.0 - 34.0 pg   MCHC 31.7 30.0 - 36.0 g/dL   RDW 40.915.5 81.111.5 - 91.415.5 %   Platelets 366 150 - 400 K/uL  Protein / creatinine ratio, urine     Status: None   Collection Time: 05/09/14  5:05 PM  Result Value Ref Range   Creatinine, Urine 108.00 mg/dL   Total Protein, Urine 8 mg/dL   Protein Creatinine Ratio 0.07 0.00 - 0.15    CMP still pending--[facility].  Consulted with Dr. Normand Sloopillard. Patient desires to go home and be notified of CMP result. She understands that if LFTs are abnormal, she will be advised to return to hospital for magnesium administration. She is in agreement with this plan. Rx Labetalol 200 mg po BID to pharmacy. PIH precautions reviewed with patient. I will have office staff contact [employer] RN tomorrow to determine if she can see patient on Friday for repeat BP check.  If not, patient will come to office for BP check.  Sherre ScarletKimberly Williams, CNM, will f/u on CMP results.  Nigel BridgemanVicki Catcher Dehoyos, CNM 05/09/14 7p

## 2014-05-09 NOTE — MAU Note (Signed)
[facility]estart nurse visit today, BP was up.  Sent in for further eval.  Last month of preg BP had mild elevation.  Day she came in labor, BP was up.  Denies HA, visual changes, epigastric pain or increased swelling.  Baby is doing well, bottle feeding. (had breast reduction, did not think would produce anything, did breast feed initially, minimal result)

## 2014-05-09 NOTE — Discharge Instructions (Signed)
Hypertension During Pregnancy °Hypertension, or high blood pressure, is when there is extra pressure inside your blood vessels that carry blood from the heart to the rest of your body (arteries). It can happen at any time in life, including pregnancy. Hypertension during pregnancy can cause problems for you and your baby. Your baby might not weigh as much as he or she should at birth or might be born early (premature). Very bad cases of hypertension during pregnancy can be life-threatening.  °Different types of hypertension can occur during pregnancy. These include: °· Chronic hypertension. This happens when a woman has hypertension before pregnancy and it continues during pregnancy. °· Gestational hypertension. This is when hypertension develops during pregnancy. °· Preeclampsia or toxemia of pregnancy. This is a very serious type of hypertension that develops only during pregnancy. It affects the whole body and can be very dangerous for both mother and baby.   °Gestational hypertension and preeclampsia usually go away after your baby is born. Your blood pressure will likely stabilize within 6 weeks. Women who have hypertension during pregnancy have a greater chance of developing hypertension later in life or with future pregnancies. °RISK FACTORS °There are certain factors that make it more likely for you to develop hypertension during pregnancy. These include: °· Having hypertension before pregnancy. °· Having hypertension during a previous pregnancy. °· Being overweight. °· Being older than 40 years. °· Being pregnant with more than one baby. °· Having diabetes or kidney problems. °SIGNS AND SYMPTOMS °Chronic and gestational hypertension rarely cause symptoms. Preeclampsia has symptoms, which may include: °· Increased protein in your urine. Your health care provider will check for this at every prenatal visit. °· Swelling of your hands and face. °· Rapid weight gain. °· Headaches. °· Visual changes. °· Being  bothered by light. °· Abdominal pain, especially in the upper right area. °· Chest pain. °· Shortness of breath. °· Increased reflexes. °· Seizures. These occur with a more severe form of preeclampsia, called eclampsia. °DIAGNOSIS  °You may be diagnosed with hypertension during a regular prenatal exam. At each prenatal visit, you may have: °· Your blood pressure checked. °· A urine test to check for protein in your urine. °The type of hypertension you are diagnosed with depends on when you developed it. It also depends on your specific blood pressure reading. °· Developing hypertension before 20 weeks of pregnancy is consistent with chronic hypertension. °· Developing hypertension after 20 weeks of pregnancy is consistent with gestational hypertension. °· Hypertension with increased urinary protein is diagnosed as preeclampsia. °· Blood pressure measurements that stay above 160 systolic or 110 diastolic are a sign of severe preeclampsia. °TREATMENT °Treatment for hypertension during pregnancy varies. Treatment depends on the type of hypertension and how serious it is. °· If you take medicine for chronic hypertension, you may need to switch medicines. °¨ Medicines called ACE inhibitors should not be taken during pregnancy. °¨ Low-dose aspirin may be suggested for women who have risk factors for preeclampsia. °· If you have gestational hypertension, you may need to take a blood pressure medicine that is safe during pregnancy. Your health care provider will recommend the correct medicine. °· If you have severe preeclampsia, you may need to be in the hospital. Health care providers will watch you and your baby very closely. You also may need to take medicine called magnesium sulfate to prevent seizures and lower blood pressure. °· Sometimes, an early delivery is needed. This may be the case if the condition worsens. It would be   done to protect you and your baby. The only cure for preeclampsia is delivery. °· Your health  care provider may recommend that you take one low-dose aspirin (81 mg) each day to help prevent high blood pressure during your pregnancy if you are at risk for preeclampsia. You may be at risk for preeclampsia if: °¨ You had preeclampsia or eclampsia during a previous pregnancy. °¨ Your baby did not grow as expected during a previous pregnancy. °¨ You experienced preterm birth with a previous pregnancy. °¨ You experienced a separation of the placenta from the uterus (placental abruption) during a previous pregnancy. °¨ You experienced the loss of your baby during a previous pregnancy. °¨ You are pregnant with more than one baby. °¨ You have other medical conditions, such as diabetes or an autoimmune disease. °HOME CARE INSTRUCTIONS °· Schedule and keep all of your regular prenatal care appointments. This is important. °· Take medicines only as directed by your health care provider. Tell your health care provider about all medicines you take. °· Eat as little salt as possible. °· Get regular exercise. °· Do not drink alcohol. °· Do not use tobacco products. °· Do not drink products with caffeine. °· Lie on your left side when resting. °SEEK IMMEDIATE MEDICAL CARE IF: °· You have severe abdominal pain. °· You have sudden swelling in your hands, ankles, or face. °· You gain 4 pounds (1.8 kg) or more in 1 week. °· You vomit repeatedly. °· You have vaginal bleeding. °· You have a headache. °· You have blurred or double vision. °· You have muscle twitching or spasms. °· You have shortness of breath. °· You have blue fingernails or lips. °· You have blood in your urine. °MAKE SURE YOU: °· Understand these instructions. °· Will watch your condition. °· Will get help right away if you are not doing well or get worse. °Document Released: 12/30/2010 Document Revised: 08/28/2013 Document Reviewed: 11/10/2012 °[employer]® Patient Information ©2015 [employer]. This information is not intended to replace advice given to you by  your health care provider. Make sure you discuss any questions you have with your health care provider. ° °

## 2015-01-09 ENCOUNTER — Ambulatory Visit (INDEPENDENT_AMBULATORY_CARE_PROVIDER_SITE_OTHER): Payer: Self-pay | Admitting: Emergency Medicine

## 2015-01-09 VITALS — BP 130/78 | HR 100 | Temp 98.4°F | Resp 18 | Ht 68.0 in | Wt 306.0 lb

## 2015-01-09 DIAGNOSIS — R21 Rash and other nonspecific skin eruption: Secondary | ICD-10-CM

## 2015-01-09 MED ORDER — PREDNISONE 10 MG (48) PO TBPK: ORAL_TABLET | ORAL | Status: DC

## 2015-01-09 NOTE — Patient Instructions (Signed)

## 2015-01-09 NOTE — Progress Notes (Signed)
Subjective:  Patient ID: Sarah Giles, female    DOB: [birth year]  Age: 24 y.o. MRN: [id number]  CC: Rash   HPI Sarah Giles presents  with a generalized rash. She has no allergy exposure. No new personal care products or medications. She had no travel. She has no pets in the house. She has no known food. She has no wheezing or shortness of breath or cough. No rhinitis or red watery eyes. She's taken some Benadryl and had some improvement in the itching with that transiently. She developed a rash when she got out of bed this morning.  History Sarah Giles has a past medical history of Allergy and Medical history non-contributory.   She has past surgical history that includes Breast surgery.   Her  family history includes Diabetes in her father and paternal grandfather; Hypertension in her maternal grandmother and mother.  She   reports that she has never smoked. She does not have any smokeless tobacco history on file. She reports that she drinks about 1.5 oz of alcohol per week. She reports that she does not use illicit drugs.  Outpatient Prescriptions Prior to Visit  Medication Sig Dispense Refill  . calcium carbonate (TUMS - DOSED IN MG ELEMENTAL CALCIUM) 500 MG chewable tablet Chew 1 tablet by mouth daily as needed for indigestion or heartburn.    . ferrous sulfate 325 (65 FE) MG tablet Take 1 tablet (325 mg total) by mouth daily with breakfast. (Patient not taking: Reported on 01/09/2015) 30 tablet 3  . ibuprofen (ADVIL,MOTRIN) 600 MG tablet Take 1 tablet (600 mg total) by mouth every 6 (six) hours as needed. (Patient not taking: Reported on 01/09/2015) 30 tablet 0  . labetalol (NORMODYNE) 200 MG tablet Take 1 tablet (200 mg total) by mouth 2 (two) times daily. (Patient not taking: Reported on 01/09/2015) 60 tablet 2  . Prenatal Vit-Fe Fumarate-FA (PRENATAL MULTIVITAMIN) TABS tablet Take 1 tablet by mouth daily at 12 noon.     No facility-administered medications prior to visit.    Social  History   Social History  . Marital Status: Unknown    Spouse Name: N/A  . Number of Children: N/A  . Years of Education: N/A   Social History Main Topics  . Smoking status: Never Smoker   . Smokeless tobacco: None  . Alcohol Use: 1.5 oz/week    3 Standard drinks or equivalent per week  . Drug Use: No  . Sexual Activity: Yes    Birth Control/ Protection: None   Other Topics Concern  . None   Social History Narrative     Review of Systems  Constitutional: Negative for fever, chills and appetite change.  HENT: Negative for congestion, ear pain, postnasal drip, sinus pressure and sore throat.   Eyes: Negative for pain and redness.  Respiratory: Negative for cough, shortness of breath and wheezing.   Cardiovascular: Negative for leg swelling.  Gastrointestinal: Negative for nausea, vomiting, abdominal pain, diarrhea, constipation and blood in stool.  Endocrine: Negative for polyuria.  Genitourinary: Negative for dysuria, urgency, frequency and flank pain.  Musculoskeletal: Negative for gait problem.  Skin: Positive for rash.  Neurological: Negative for weakness and headaches.  Psychiatric/Behavioral: Negative for confusion and decreased concentration. The patient is not nervous/anxious.     Objective:  BP 130/78 mmHg  Pulse 100  Temp(Src) 98.4 F (36.9 C) (Oral)  Resp 18  Ht 5\' 8"  (1.727 m)  Wt 306 lb (138.801 kg)  BMI 46.54 kg/m2  SpO2 98%  LMP  12/28/2014  Physical Exam  Constitutional: She is oriented to person, place, and time. She appears well-developed and well-nourished.  HENT:  Head: Normocephalic and atraumatic.  Eyes: Conjunctivae are normal. Pupils are equal, round, and reactive to light.  Pulmonary/Chest: Effort normal.  Musculoskeletal: She exhibits no edema.  Neurological: She is alert and oriented to person, place, and time.  Skin: Skin is dry. Rash noted.  Erythematous maculo papular rash generalized.  Fine.    Psychiatric: She has a normal mood  and affect. Her behavior is normal. Thought content normal.      Assessment & Plan:   Sarah Giles was seen today for rash.  Diagnoses and all orders for this visit:  Rash and nonspecific skin eruption  Morbid obesity  Other orders -     predniSONE (STERAPRED UNI-PAK 48 TAB) 10 MG (48) TBPK tablet; Take as directed on package  I am having Sarah Giles start on predniSONE. I am also having her maintain her prenatal multivitamin, calcium carbonate, ferrous sulfate, ibuprofen, labetalol, and Vitamin D (Ergocalciferol).  Meds ordered this encounter  Medications  . Vitamin D, Ergocalciferol, (DRISDOL) 50000 UNITS CAPS capsule    Sig: Take 50,000 Units by mouth every 7 (seven) days.  . predniSONE (STERAPRED UNI-PAK 48 TAB) 10 MG (48) TBPK tablet    Sig: Take as directed on package    Dispense:  48 tablet    Refill:  0    Appropriate red flag conditions were discussed with the patient as well as actions that should be taken.  Patient expressed his understanding.  Follow-up: Return if symptoms worsen or fail to improve.  Carmelina Dane, MD

## 2015-10-04 ENCOUNTER — Other Ambulatory Visit: Payer: Self-pay | Admitting: Nurse Practitioner

## 2015-10-04 ENCOUNTER — Other Ambulatory Visit: Payer: Self-pay | Admitting: Internal Medicine

## 2015-10-04 ENCOUNTER — Ambulatory Visit
Admission: RE | Admit: 2015-10-04 | Discharge: 2015-10-04 | Disposition: A | Discharge status: Home / Self Care | Payer: BLUE CROSS/BLUE SHIELD | Source: Ambulatory Visit | Attending: Internal Medicine | Admitting: Internal Medicine

## 2015-10-04 DIAGNOSIS — R634 Abnormal weight loss: Secondary | ICD-10-CM

## 2015-12-17 DIAGNOSIS — Z30017 Encounter for initial prescription of implantable subdermal contraceptive: Secondary | ICD-10-CM | POA: Diagnosis not present

## 2015-12-17 DIAGNOSIS — Z3040 Encounter for surveillance of contraceptives, unspecified: Secondary | ICD-10-CM | POA: Diagnosis not present

## 2015-12-31 DIAGNOSIS — H1033 Unspecified acute conjunctivitis, bilateral: Secondary | ICD-10-CM | POA: Diagnosis not present

## 2016-11-02 DIAGNOSIS — N921 Excessive and frequent menstruation with irregular cycle: Secondary | ICD-10-CM | POA: Diagnosis not present

## 2016-11-02 DIAGNOSIS — Z01419 Encounter for gynecological examination (general) (routine) without abnormal findings: Secondary | ICD-10-CM | POA: Diagnosis not present

## 2016-11-02 DIAGNOSIS — Z113 Encounter for screening for infections with a predominantly sexual mode of transmission: Secondary | ICD-10-CM | POA: Diagnosis not present

## 2016-11-02 DIAGNOSIS — Z124 Encounter for screening for malignant neoplasm of cervix: Secondary | ICD-10-CM | POA: Diagnosis not present

## 2016-11-02 DIAGNOSIS — E559 Vitamin D deficiency, unspecified: Secondary | ICD-10-CM | POA: Diagnosis not present

## 2016-11-03 DIAGNOSIS — N921 Excessive and frequent menstruation with irregular cycle: Secondary | ICD-10-CM | POA: Diagnosis not present

## 2016-11-03 DIAGNOSIS — Z113 Encounter for screening for infections with a predominantly sexual mode of transmission: Secondary | ICD-10-CM | POA: Diagnosis not present

## 2016-11-03 DIAGNOSIS — Z124 Encounter for screening for malignant neoplasm of cervix: Secondary | ICD-10-CM | POA: Diagnosis not present

## 2016-11-03 DIAGNOSIS — E559 Vitamin D deficiency, unspecified: Secondary | ICD-10-CM | POA: Diagnosis not present

## 2016-12-09 DIAGNOSIS — N921 Excessive and frequent menstruation with irregular cycle: Secondary | ICD-10-CM | POA: Diagnosis not present

## 2016-12-09 DIAGNOSIS — N83202 Unspecified ovarian cyst, left side: Secondary | ICD-10-CM | POA: Diagnosis not present

## 2017-03-04 DIAGNOSIS — N83201 Unspecified ovarian cyst, right side: Secondary | ICD-10-CM | POA: Diagnosis not present

## 2017-03-04 DIAGNOSIS — E559 Vitamin D deficiency, unspecified: Secondary | ICD-10-CM | POA: Diagnosis not present

## 2017-03-04 DIAGNOSIS — N921 Excessive and frequent menstruation with irregular cycle: Secondary | ICD-10-CM | POA: Diagnosis not present

## 2017-03-04 DIAGNOSIS — Z09 Encounter for follow-up examination after completed treatment for conditions other than malignant neoplasm: Secondary | ICD-10-CM | POA: Diagnosis not present

## 2017-03-04 DIAGNOSIS — N83202 Unspecified ovarian cyst, left side: Secondary | ICD-10-CM | POA: Diagnosis not present

## 2017-06-09 DIAGNOSIS — E559 Vitamin D deficiency, unspecified: Secondary | ICD-10-CM | POA: Diagnosis not present

## 2017-06-09 DIAGNOSIS — Z09 Encounter for follow-up examination after completed treatment for conditions other than malignant neoplasm: Secondary | ICD-10-CM | POA: Diagnosis not present

## 2017-06-09 DIAGNOSIS — N921 Excessive and frequent menstruation with irregular cycle: Secondary | ICD-10-CM | POA: Diagnosis not present

## 2017-10-05 DIAGNOSIS — R7303 Prediabetes: Secondary | ICD-10-CM | POA: Diagnosis not present

## 2017-10-05 DIAGNOSIS — Z0000 Encounter for general adult medical examination without abnormal findings: Secondary | ICD-10-CM | POA: Diagnosis not present

## 2017-10-05 DIAGNOSIS — Z13220 Encounter for screening for lipoid disorders: Secondary | ICD-10-CM | POA: Diagnosis not present

## 2017-10-05 DIAGNOSIS — E559 Vitamin D deficiency, unspecified: Secondary | ICD-10-CM | POA: Diagnosis not present

## 2017-10-05 DIAGNOSIS — R030 Elevated blood-pressure reading, without diagnosis of hypertension: Secondary | ICD-10-CM | POA: Diagnosis not present

## 2017-11-15 DIAGNOSIS — Z01411 Encounter for gynecological examination (general) (routine) with abnormal findings: Secondary | ICD-10-CM | POA: Diagnosis not present

## 2017-11-15 DIAGNOSIS — Z01419 Encounter for gynecological examination (general) (routine) without abnormal findings: Secondary | ICD-10-CM | POA: Diagnosis not present

## 2017-11-15 DIAGNOSIS — Z6841 Body Mass Index (BMI) 40.0 and over, adult: Secondary | ICD-10-CM | POA: Diagnosis not present

## 2017-11-15 DIAGNOSIS — N921 Excessive and frequent menstruation with irregular cycle: Secondary | ICD-10-CM | POA: Diagnosis not present

## 2017-11-15 DIAGNOSIS — Z113 Encounter for screening for infections with a predominantly sexual mode of transmission: Secondary | ICD-10-CM | POA: Diagnosis not present

## 2017-11-15 DIAGNOSIS — E559 Vitamin D deficiency, unspecified: Secondary | ICD-10-CM | POA: Diagnosis not present

## 2017-11-15 DIAGNOSIS — Z124 Encounter for screening for malignant neoplasm of cervix: Secondary | ICD-10-CM | POA: Diagnosis not present

## 2017-12-06 DIAGNOSIS — Z3049 Encounter for surveillance of other contraceptives: Secondary | ICD-10-CM | POA: Diagnosis not present

## 2017-12-06 DIAGNOSIS — Z3046 Encounter for surveillance of implantable subdermal contraceptive: Secondary | ICD-10-CM | POA: Diagnosis not present

## 2017-12-06 DIAGNOSIS — I10 Essential (primary) hypertension: Secondary | ICD-10-CM | POA: Diagnosis not present

## 2017-12-20 DIAGNOSIS — Z09 Encounter for follow-up examination after completed treatment for conditions other than malignant neoplasm: Secondary | ICD-10-CM | POA: Diagnosis not present

## 2017-12-20 DIAGNOSIS — I10 Essential (primary) hypertension: Secondary | ICD-10-CM | POA: Diagnosis not present

## 2018-01-05 DIAGNOSIS — Z713 Dietary counseling and surveillance: Secondary | ICD-10-CM | POA: Diagnosis not present

## 2018-01-05 DIAGNOSIS — I10 Essential (primary) hypertension: Secondary | ICD-10-CM | POA: Diagnosis not present

## 2018-03-13 DIAGNOSIS — F4320 Adjustment disorder, unspecified: Secondary | ICD-10-CM | POA: Diagnosis not present

## 2018-04-27 NOTE — L&D Delivery Note (Addendum)
Delivery Note Labor onset: 03/06/19   Labor Onset Time: 1254 Complete dilation at 3:30 AM  Onset of pushing at 0456 FHR second stage Cat 1 Analgesia/Anesthesia intrapartum: Epidural  Delivery of a viable female with the force of one ctx at 0458. Fetal head delivered in OA position and restituted to LOA immediately followed by anterior shoulder. No nuchal cord. Lusty cry following birth. Infant placed on maternal abd, dried, and tactile stim.  Cord double clamped after pulsation stopped and cut by Remo Lipps, father.  Cord blood sample collected Arterial cord blood sample N/A.  Placenta delivered Delena Bali, intact, with 3 VC.  Placenta to L&D. Uterine tone firm w/ minimal bleeding   No laceration identified.  Anesthesia: N.A Repair: N.A QBL/EBL (mL): Complications: None APGAR: APGAR (1 MIN): 9   APGAR (5 MINS): 9   APGAR (10 MINS):   Mom to postpartum.  Baby to Couplet care / Skin to Skin. Female, desires in-patient circ.  Arrie Eastern MSN, CNM 03/07/2019, 5:25 AM

## 2018-07-05 DIAGNOSIS — I10 Essential (primary) hypertension: Secondary | ICD-10-CM | POA: Diagnosis not present

## 2018-07-05 DIAGNOSIS — E559 Vitamin D deficiency, unspecified: Secondary | ICD-10-CM | POA: Diagnosis not present

## 2018-07-14 DIAGNOSIS — F411 Generalized anxiety disorder: Secondary | ICD-10-CM | POA: Diagnosis not present

## 2018-07-22 DIAGNOSIS — F411 Generalized anxiety disorder: Secondary | ICD-10-CM | POA: Diagnosis not present

## 2018-07-26 DIAGNOSIS — I10 Essential (primary) hypertension: Secondary | ICD-10-CM | POA: Diagnosis not present

## 2018-07-26 DIAGNOSIS — Z9889 Other specified postprocedural states: Secondary | ICD-10-CM | POA: Insufficient documentation

## 2018-07-26 DIAGNOSIS — Z369 Encounter for antenatal screening, unspecified: Secondary | ICD-10-CM | POA: Diagnosis not present

## 2018-07-26 DIAGNOSIS — N925 Other specified irregular menstruation: Secondary | ICD-10-CM | POA: Diagnosis not present

## 2018-07-26 DIAGNOSIS — E559 Vitamin D deficiency, unspecified: Secondary | ICD-10-CM | POA: Diagnosis not present

## 2018-07-26 LAB — OB RESULTS CONSOLE HEPATITIS B SURFACE ANTIGEN: Hepatitis B Surface Ag: NEGATIVE

## 2018-07-26 LAB — OB RESULTS CONSOLE GC/CHLAMYDIA
Chlamydia: NEGATIVE
Gonorrhea: NEGATIVE

## 2018-07-26 LAB — OB RESULTS CONSOLE ABO/RH: RH Type: POSITIVE

## 2018-07-26 LAB — OB RESULTS CONSOLE RUBELLA ANTIBODY, IGM: Rubella: IMMUNE

## 2018-07-26 LAB — OB RESULTS CONSOLE RPR: RPR: NONREACTIVE

## 2018-07-26 LAB — OB RESULTS CONSOLE ANTIBODY SCREEN: Antibody Screen: NEGATIVE

## 2018-07-26 LAB — OB RESULTS CONSOLE GBS: GBS: POSITIVE

## 2018-07-26 LAB — OB RESULTS CONSOLE HIV ANTIBODY (ROUTINE TESTING): HIV: NONREACTIVE

## 2018-07-28 DIAGNOSIS — Z3490 Encounter for supervision of normal pregnancy, unspecified, unspecified trimester: Secondary | ICD-10-CM | POA: Insufficient documentation

## 2018-07-29 DIAGNOSIS — Z22330 Carrier of Group B streptococcus: Secondary | ICD-10-CM | POA: Insufficient documentation

## 2018-08-01 DIAGNOSIS — F411 Generalized anxiety disorder: Secondary | ICD-10-CM | POA: Diagnosis not present

## 2018-08-12 DIAGNOSIS — F411 Generalized anxiety disorder: Secondary | ICD-10-CM | POA: Diagnosis not present

## 2018-08-23 DIAGNOSIS — R7309 Other abnormal glucose: Secondary | ICD-10-CM | POA: Diagnosis not present

## 2018-08-23 DIAGNOSIS — D582 Other hemoglobinopathies: Secondary | ICD-10-CM | POA: Diagnosis not present

## 2018-08-23 DIAGNOSIS — Z3401 Encounter for supervision of normal first pregnancy, first trimester: Secondary | ICD-10-CM | POA: Diagnosis not present

## 2018-08-23 DIAGNOSIS — Z3A10 10 weeks gestation of pregnancy: Secondary | ICD-10-CM | POA: Diagnosis not present

## 2018-08-23 DIAGNOSIS — R8271 Bacteriuria: Secondary | ICD-10-CM | POA: Diagnosis not present

## 2018-08-23 DIAGNOSIS — Z3481 Encounter for supervision of other normal pregnancy, first trimester: Secondary | ICD-10-CM | POA: Diagnosis not present

## 2018-08-23 DIAGNOSIS — O3680X9 Pregnancy with inconclusive fetal viability, other fetus: Secondary | ICD-10-CM | POA: Diagnosis not present

## 2018-09-09 DIAGNOSIS — F411 Generalized anxiety disorder: Secondary | ICD-10-CM | POA: Diagnosis not present

## 2018-09-14 ENCOUNTER — Encounter (HOSPITAL_COMMUNITY): Payer: Self-pay

## 2018-09-14 ENCOUNTER — Other Ambulatory Visit: Payer: Self-pay

## 2018-09-14 ENCOUNTER — Emergency Department (HOSPITAL_COMMUNITY)
Admission: EM | Admit: 2018-09-14 | Discharge: 2018-09-14 | Disposition: A | Discharge status: Home / Self Care | Payer: BLUE CROSS/BLUE SHIELD | Attending: Emergency Medicine | Admitting: Emergency Medicine

## 2018-09-14 DIAGNOSIS — O2341 Unspecified infection of urinary tract in pregnancy, first trimester: Secondary | ICD-10-CM | POA: Diagnosis not present

## 2018-09-14 DIAGNOSIS — M25512 Pain in left shoulder: Secondary | ICD-10-CM | POA: Insufficient documentation

## 2018-09-14 DIAGNOSIS — R202 Paresthesia of skin: Secondary | ICD-10-CM | POA: Diagnosis not present

## 2018-09-14 DIAGNOSIS — R42 Dizziness and giddiness: Secondary | ICD-10-CM | POA: Diagnosis not present

## 2018-09-14 DIAGNOSIS — Z3A13 13 weeks gestation of pregnancy: Secondary | ICD-10-CM | POA: Insufficient documentation

## 2018-09-14 DIAGNOSIS — I10 Essential (primary) hypertension: Secondary | ICD-10-CM | POA: Diagnosis not present

## 2018-09-14 DIAGNOSIS — R8271 Bacteriuria: Secondary | ICD-10-CM | POA: Diagnosis not present

## 2018-09-14 DIAGNOSIS — R0789 Other chest pain: Secondary | ICD-10-CM | POA: Insufficient documentation

## 2018-09-14 DIAGNOSIS — R0602 Shortness of breath: Secondary | ICD-10-CM | POA: Diagnosis not present

## 2018-09-14 DIAGNOSIS — I499 Cardiac arrhythmia, unspecified: Secondary | ICD-10-CM | POA: Diagnosis not present

## 2018-09-14 DIAGNOSIS — O2692 Pregnancy related conditions, unspecified, second trimester: Secondary | ICD-10-CM | POA: Insufficient documentation

## 2018-09-14 DIAGNOSIS — Z3490 Encounter for supervision of normal pregnancy, unspecified, unspecified trimester: Secondary | ICD-10-CM

## 2018-09-14 DIAGNOSIS — O99891 Other specified diseases and conditions complicating pregnancy: Secondary | ICD-10-CM

## 2018-09-14 DIAGNOSIS — O9989 Other specified diseases and conditions complicating pregnancy, childbirth and the puerperium: Secondary | ICD-10-CM | POA: Insufficient documentation

## 2018-09-14 HISTORY — DX: Essential (primary) hypertension: I10

## 2018-09-14 LAB — URINALYSIS, ROUTINE W REFLEX MICROSCOPIC
Bilirubin Urine: NEGATIVE
Glucose, UA: NEGATIVE mg/dL
Ketones, ur: 5 mg/dL — AB
Leukocytes,Ua: NEGATIVE
Nitrite: NEGATIVE
Protein, ur: 30 mg/dL — AB
Specific Gravity, Urine: 1.021 (ref 1.005–1.030)
pH: 6 (ref 5.0–8.0)

## 2018-09-14 LAB — TROPONIN I: Troponin I: <0.03 ng/mL (ref <0.03)

## 2018-09-14 LAB — CBC
HCT: 39.1 % (ref 36.0–46.0)
Hemoglobin: 12.3 g/dL (ref 12.0–15.0)
MCH: 25.7 pg — ABNORMAL LOW (ref 26.0–34.0)
MCHC: 31.5 g/dL (ref 30.0–36.0)
MCV: 81.8 fL (ref 80.0–100.0)
Platelets: 318 10*3/uL (ref 150–400)
RBC: 4.78 MIL/uL (ref 3.87–5.11)
RDW: 14.5 % (ref 11.5–15.5)
WBC: 10.3 10*3/uL (ref 4.0–10.5)
nRBC: 0 % (ref 0.0–0.2)

## 2018-09-14 LAB — COMPREHENSIVE METABOLIC PANEL
ALT: 13 U/L (ref 0–44)
AST: 13 U/L — ABNORMAL LOW (ref 15–41)
Albumin: 3.5 g/dL (ref 3.5–5.0)
Alkaline Phosphatase: 48 U/L (ref 38–126)
Anion gap: 13 (ref 5–15)
BUN: 8 mg/dL (ref 6–20)
CO2: 20 mmol/L — ABNORMAL LOW (ref 22–32)
Calcium: 9.4 mg/dL (ref 8.9–10.3)
Chloride: 105 mmol/L (ref 98–111)
Creatinine, Ser: 0.7 mg/dL (ref 0.44–1.00)
GFR calc Af Amer: >60 mL/min (ref >60)
GFR calc non Af Amer: >60 mL/min (ref >60)
Glucose, Bld: 86 mg/dL (ref 70–99)
Potassium: 3.5 mmol/L (ref 3.5–5.1)
Sodium: 138 mmol/L (ref 135–145)
Total Bilirubin: 0.3 mg/dL (ref 0.3–1.2)
Total Protein: 7.1 g/dL (ref 6.5–8.1)

## 2018-09-14 MED ORDER — CEPHALEXIN 500 MG PO CAPS: 500.0000 mg | ORAL_CAPSULE | Freq: Two times a day (BID) | ORAL | 0 refills | Status: DC

## 2018-09-14 NOTE — ED Provider Notes (Signed)
[facility] Provider Note   CSN: [id number] Arrival date & time: 09/14/18  0840    History   Chief Complaint No chief complaint on file.   HPI Sarah Giles is a 28 y.o. female.     HPI Patient presents with a few different complaints.  States she woke up in bed.  Normally sleeps to the left side down but had woken up and then laid with her right side down.  States that she woke up with the pain in her anterior left shoulder and that her left arm felt as if it had fallen asleep.  States she got up and walked around and felt a little dizzy.  States she then began to panic some.  States was lightheaded and a little bit of shortness of breath right at that time.  Feeling somewhat better now.  States she is [redacted] weeks pregnant and was somewhat nervous.  History of hypertension.  EMS stated that a manual blood pressure was 230/150.  In the ambulance she took her blood pressure medicine and a recheck showed blood pressure 120/67.  Patient does have a family history of cardiac disease.  No swelling in her legs.  No back pain.  Still thinks that she feels baby moving although it has not felt much consistent movement overall.  No dysuria.  No vaginal bleeding or discharge. Past Medical History:  Diagnosis Date  . Allergy   . Hypertension   . Medical history non-contributory     Patient Active Problem List   Diagnosis Date Noted  . NSVD (normal spontaneous vaginal delivery) 04/28/2014  . Morbid obesity (HCC) 04/27/2014  . Gestational hypertension 04/27/2014    Past Surgical History:  Procedure Laterality Date  . BREAST SURGERY     2008     OB History    Gravida  1   Para  1   Term  1   Preterm      AB      Living  1     SAB      TAB      Ectopic      Multiple  0   Live Births  1            Home Medications    Prior to Admission medications   Medication Sig Start Date End Date Taking? Authorizing Provider  ibuprofen  (ADVIL) 200 MG tablet Take 400 mg by mouth every 6 (six) hours as needed for moderate pain.   Yes [provider]  labetalol (NORMODYNE) 200 MG tablet Take 1 tablet (200 mg total) by mouth 2 (two) times daily. 05/09/14  Yes Nigel BridgemanLatham, Vicki, CNM  Prenatal Vit-Fe Fumarate-FA (PRENATAL MULTIVITAMIN) TABS tablet Take 1 tablet by mouth daily at 12 noon.   Yes [provider]  cephALEXin (KEFLEX) 500 MG capsule Take 1 capsule (500 mg total) by mouth 2 (two) times daily. 09/14/18   Benjiman CorePickering, Novis League, MD  ferrous sulfate 325 (65 FE) MG tablet Take 1 tablet (325 mg total) by mouth daily with breakfast. Patient not taking: Reported on 01/09/2015 04/30/14   Nigel BridgemanLatham, Vicki, CNM  ibuprofen (ADVIL,MOTRIN) 600 MG tablet Take 1 tablet (600 mg total) by mouth every 6 (six) hours as needed. Patient not taking: Reported on 01/09/2015 04/30/14   Nigel BridgemanLatham, Vicki, CNM  predniSONE (STERAPRED UNI-PAK 48 TAB) 10 MG (48) TBPK tablet Take as directed on package Patient not taking: Reported on 09/14/2018 01/09/15   Carmelina DaneAnderson, Jeffery S, MD  Family History Family History  Problem Relation Age of Onset  . Hypertension Mother   . Diabetes Father   . Hypertension Maternal Grandmother   . Diabetes Paternal Grandfather     Social History Social History   Tobacco Use  . Smoking status: Never Smoker  . Smokeless tobacco: Never Used  Substance Use Topics  . Alcohol use: Not Currently    Alcohol/week: 3.0 standard drinks    Types: 3 Standard drinks or equivalent per week  . Drug use: Not Currently     Allergies   Patient has no known allergies.   Review of Systems Review of Systems  Constitutional: Negative for appetite change and fever.  HENT: Negative for congestion.   Respiratory: Positive for shortness of breath.   Cardiovascular: Positive for chest pain.  Gastrointestinal: Negative for abdominal pain.  Genitourinary: Negative for flank pain, vaginal bleeding and vaginal discharge.  Musculoskeletal:  Negative for back pain.  Skin: Negative for rash.  Neurological: Positive for light-headedness and numbness.  Hematological: Negative for adenopathy.  Psychiatric/Behavioral: Negative for confusion.     Physical Exam Updated Vital Signs BP 116/67   Pulse 72   Temp 98.8 F (37.1 C) (Oral)   Resp 15   Ht  (1.702 m)   Wt 134.7 kg   SpO2 100%   BMI 46.52 kg/m   Physical Exam Vitals signs and nursing note reviewed.  HENT:     Head: Atraumatic.  Cardiovascular:     Rate and Rhythm: Regular rhythm.     Heart sounds: No murmur.  Pulmonary:     Effort: Pulmonary effort is normal.     Breath sounds: No rhonchi.  Chest:     Chest wall: No tenderness.  Abdominal:     General: There is no distension.     Comments: Patient is obese.  Musculoskeletal:     Right lower leg: No edema.     Left lower leg: No edema.  Skin:    General: Skin is warm.  Neurological:     Mental Status: She is alert.     Comments: Strength and sensation intact in left upper extremity.  Pulse intact.      ED Treatments / Results  Labs (all labs ordered are listed, but only abnormal results are displayed) Labs Reviewed  CBC - Abnormal; Notable for the following components:      Result Value   MCH 25.7 (*)    All other components within normal limits  COMPREHENSIVE METABOLIC PANEL - Abnormal; Notable for the following components:   CO2 20 (*)    AST 13 (*)    All other components within normal limits  URINALYSIS, ROUTINE W REFLEX MICROSCOPIC - Abnormal; Notable for the following components:   Hgb urine dipstick SMALL (*)    Ketones, ur 5 (*)    Protein, ur 30 (*)    Bacteria, UA RARE (*)    All other components within normal limits  URINE CULTURE  TROPONIN I    EKG EKG Interpretation  Date/Time:  Wednesday Sep 14 2018 08:43:56 EDT Ventricular Rate:  85 PR Interval:    QRS Duration: 98 QT Interval:  372 QTC Calculation: 443 R Axis:   43 Text Interpretation:  Sinus rhythm  Confirmed by Benjiman Core [phone]) on 09/14/2018 9:02:12 AM   Radiology No results found.  Procedures Procedures (including critical care time)  Medications Ordered in ED Medications - No data to display   Initial Impression / Assessment and Plan /  ED Course  I have reviewed the triage vital signs and the nursing notes.  Pertinent labs & imaging results that were available during my care of the patient were reviewed by me and considered in my medical decision making (see chart for details).        Patient with left upper extremity numbness.  Began while she was sleeping.  Improved upon waking and repositioning.  However became more shortness of breath after.  EMS reportedly had a blood pressure of 230 although it immediately went down to 120 without any intervention.  Either likely a false reading or had been anxiety related which had resolved when she was feeling better.  Lab work reassuring.  EKG reassuring.  Done due to some chest pain and family cardiac history.  Blood pressure stable here.  Does have some bacteria in the urine however.  Will treat with antibiotics.  Discharge home.  Final Clinical Impressions(s) / ED Diagnoses   Final diagnoses:  Paresthesia  Pregnancy, unspecified gestational age  Bacteriuria during pregnancy    ED Discharge Orders         Ordered    cephALEXin (KEFLEX) 500 MG capsule  2 times daily     09/14/18 1208           Benjiman Core, MD 09/14/18 1210

## 2018-09-14 NOTE — ED Triage Notes (Signed)
Pt arrives with [facility] c/o numbness and tingling this morning on left arm. Pt reports laying in bed on right side when left arm felt "tingly and numb" from shoulder to fingertips. Pt also reports dizzy, lightheaded, and short of breath. Pt states pain was 6/10. Per EMS, manual BP was 230/150. Pt has hx of HTN and was taking HCTZ but was switched to labetolol in beginning of April d/t pt being [redacted] weeks pregnant. Pt states she took her BP medication in EMS truck en route to ED. Pt states pain has now dissipated. EMS vitals en route:  78 HR 120/67 BP 100 % RA

## 2018-09-14 NOTE — ED Notes (Signed)
This RN assisted pt ambulate to restroom. 

## 2018-09-14 NOTE — Discharge Instructions (Addendum)
Since there was some bacteria in the urine and you are pregnant we will treat it.  I think likely the arm was numb from you having it in a strange position.  Follow-up with your doctors as needed.

## 2018-09-14 NOTE — ED Notes (Signed)
Patient verbalizes understanding of discharge instructions. Opportunity for questioning and answers were provided. Armband removed by staff, pt discharged from ED.  

## 2018-09-15 DIAGNOSIS — R6889 Other general symptoms and signs: Secondary | ICD-10-CM | POA: Diagnosis not present

## 2018-09-15 LAB — URINE CULTURE

## 2018-09-16 DIAGNOSIS — F411 Generalized anxiety disorder: Secondary | ICD-10-CM | POA: Diagnosis not present

## 2018-09-21 DIAGNOSIS — F411 Generalized anxiety disorder: Secondary | ICD-10-CM | POA: Diagnosis not present

## 2018-10-03 DIAGNOSIS — F411 Generalized anxiety disorder: Secondary | ICD-10-CM | POA: Diagnosis not present

## 2018-10-12 DIAGNOSIS — F411 Generalized anxiety disorder: Secondary | ICD-10-CM | POA: Diagnosis not present

## 2018-10-25 DIAGNOSIS — R42 Dizziness and giddiness: Secondary | ICD-10-CM | POA: Diagnosis not present

## 2018-10-25 DIAGNOSIS — R0600 Dyspnea, unspecified: Secondary | ICD-10-CM | POA: Diagnosis not present

## 2018-11-07 ENCOUNTER — Telehealth: Payer: Self-pay | Admitting: Cardiovascular Disease

## 2018-11-07 NOTE — Telephone Encounter (Signed)

## 2018-11-08 ENCOUNTER — Other Ambulatory Visit: Payer: Self-pay

## 2018-11-08 ENCOUNTER — Ambulatory Visit: Payer: BLUE CROSS/BLUE SHIELD | Admitting: Cardiovascular Disease

## 2018-11-08 ENCOUNTER — Encounter: Payer: Self-pay | Admitting: Cardiovascular Disease

## 2018-11-08 VITALS — BP 118/76 | HR 107 | Ht 67.0 in | Wt 302.6 lb

## 2018-11-08 DIAGNOSIS — I10 Essential (primary) hypertension: Secondary | ICD-10-CM | POA: Insufficient documentation

## 2018-11-08 DIAGNOSIS — R42 Dizziness and giddiness: Secondary | ICD-10-CM

## 2018-11-08 DIAGNOSIS — R002 Palpitations: Secondary | ICD-10-CM | POA: Diagnosis not present

## 2018-11-08 NOTE — Progress Notes (Signed)
Cardiology Office Note:    Date:  11/08/2018   ID:  Sarah Giles, DOB October 31, 1990, MRN [id number]  PCP:  Kristen Loader, FNP  Cardiologist:  No primary care provider on file.  Electrophysiologist:  None   Referring MD: Crawford Givens, MD   Chief Complaint  Patient presents with  . Shortness of Breath    November 08, 2018    Sarah Giles is a 28 y.o. female with a hx of dyspnea , HTN  She is [redacted] weeks pregnant.   We are asked to see her today by Dr. brake for further evaluation of these palpitations and dizziness.  Changed from HCTZ to Labetalol with pregnancy    3 weeks ago, she woke up very short of breath. Couldn't breath, lightheadedness. Went to the ER  Work up was normal .  HR was very fast  Bought a pulse oxymeter  Was very nervous,   Occurs with sitting or lying down. ,  Feels great when standing   her OB has suggested hypoglycemia as a possible dx She has been eating more protein and feels   Has not been exercising  Works at [employer]   This is 2nd pregnancy,   No complications with 1st pregnancy  Had marked HTN on day of delivery   Hx of    Past Medical History:  Diagnosis Date  . Allergy   . Gestational hypertension 04/27/2014  . Hypertension   . Medical history non-contributory   . Morbid obesity ([place]) 04/27/2014  . NSVD (normal spontaneous vaginal delivery) 04/28/2014    Past Surgical History:  Procedure Laterality Date  . BREAST SURGERY     2008    Current Medications: Current Meds  Medication Sig  . ibuprofen (ADVIL) 200 MG tablet Take 400 mg by mouth every 6 (six) hours as needed for moderate pain.  Marland Kitchen labetalol (NORMODYNE) 200 MG tablet Take 1 tablet (200 mg total) by mouth 2 (two) times daily.  . Prenatal Vit-Fe Fumarate-FA (PRENATAL MULTIVITAMIN) TABS tablet Take 1 tablet by mouth daily at 12 noon.     Allergies:   Patient has no known allergies.   Social History   Socioeconomic History  . Marital status:  Unknown    Spouse name: Not on file  . Number of children: Not on file  . Years of education: Not on file  . Highest education level: Not on file  Occupational History  . Not on file  Social Needs  . Financial resource strain: Not on file  . Food insecurity    Worry: Not on file    Inability: Not on file  . Transportation needs    Medical: Not on file    Non-medical: Not on file  Tobacco Use  . Smoking status: Never Smoker  . Smokeless tobacco: Never Used  Substance and Sexual Activity  . Alcohol use: Not Currently    Alcohol/week: 3.0 standard drinks    Types: 3 Standard drinks or equivalent per week  . Drug use: Not Currently  . Sexual activity: Yes    Birth control/protection: None  Lifestyle  . Physical activity    Days per week: Not on file    Minutes per session: Not on file  . Stress: Not on file  Relationships  . Social Herbalist on phone: Not on file    Gets together: Not on file    Attends religious service: Not on file    Active  member of club or organization: Not on file    Attends meetings of clubs or organizations: Not on file    Relationship status: Not on file  Other Topics Concern  . Not on file  Social History Narrative  . Not on file     Family History: The patient's family history includes Diabetes in her father and paternal grandfather; Hypertension in her maternal grandmother and mother.  ROS:   Please see the history of present illness.     All other systems reviewed and are negative.  EKGs/Labs/Other Studies Reviewed:    The following studies were reviewed today:   EKG:  EKG   - Sep 14, 2018,  NSR at 85.  Recent Labs: 09/14/2018: ALT 13; BUN 8; Creatinine, Ser 0.70; Hemoglobin 12.3; Platelets 318; Potassium 3.5; Sodium 138  Recent Lipid Panel No results found for: CHOL, TRIG, HDL, CHOLHDL, VLDL, LDLCALC, LDLDIRECT  Physical Exam:    VS:  BP 118/76   Pulse (!) 107   Ht 5\' 7"  (1.702 m)   Wt (!) 302 lb 9.6 oz (137.3  kg)   SpO2 98%   BMI 47.39 kg/m     Wt Readings from Last 3 Encounters:  11/08/18 (!) 302 lb 9.6 oz (137.3 kg)  09/14/18 297 lb (134.7 kg)  01/09/15 (!) 306 lb (138.8 kg)     GEN: Young female, moderately to morbidly obese. HEENT: Normal NECK: No JVD; No carotid bruits LYMPHATICS: No lymphadenopathy CARDIAC regular rate, S1-S2, no significant murmurs RESPIRATORY:  Clear to auscultation without rales, wheezing or rhonchi  ABDOMEN: Soft, non-tender, non-distended MUSCULOSKELETAL:  No edema; No deformity  SKIN: Warm and dry NEUROLOGIC:  Alert and oriented x 3 PSYCHIATRIC:  Normal affect   ASSESSMENT:    No diagnosis found. PLAN:    In order of problems listed above:  1. Palpitations: Anthonette presents with episodes of palpitations that are associated with weakness and dizziness and lightheadedness.  She is not for sure of the mechanism.  They typically occur when she is lying down or sitting down.  They have never occurred when she standing up.  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  He is also suggested hypoglycemia.  We will get an echocardiogram for further evaluation of her heart.  She has normal cardiac exam.  We will also place an event monitor for 30 days.  I will see her again in 3 months for follow-up office visit.   Medication Adjustments/Labs and Tests Ordered: Current medicines are reviewed at length with the patient today.  Concerns regarding medicines are outlined above.  No orders of the defined types were placed in this encounter.  No orders of the defined types were placed in this encounter.   There are no Patient Instructions on file for this visit.   Signed, Kristeen MissPhilip Chinonso Linker, MD  11/08/2018 2:47 PM    [facility]

## 2018-11-08 NOTE — Patient Instructions (Signed)
Medication Instructions:  Your physician recommends that you continue on your current medications as directed. Please refer to the Current Medication list given to you today.  If you need a refill on your cardiac medications before your next appointment, please call your pharmacy.   Lab work: None Ordered  If you have labs (blood work) drawn today and your tests are completely normal, you will receive your results only by: Marland Kitchen MyChart Message (if you have MyChart) OR . A paper copy in the mail If you have any lab test that is abnormal or we need to change your treatment, we will call you to review the results.  Testing/Procedures: Your physician has recommended that you wear an event monitor. Event monitors are medical devices that record the heart's electrical activity. Doctors most often [place] these monitors to diagnose arrhythmias. Arrhythmias are problems with the speed or rhythm of the heartbeat. The monitor is a small, portable device. You can wear one while you do your normal daily activities. This is usually used to diagnose what is causing palpitations/syncope (passing out).  Your physician has requested that you have an echocardiogram. Echocardiography is a painless test that uses sound waves to create images of your heart. It provides your doctor with information about the size and shape of your heart and how well your heart's chambers and valves are working. This procedure takes approximately one hour. There are no restrictions for this procedure.    Follow-Up: At [facility], you and your health needs are our priority.  As part of our continuing mission to provide you with exceptional heart care, we have created designated Provider Care Teams.  These Care Teams include your primary Cardiologist (physician) and Advanced Practice Providers (APPs -  Physician Assistants and Nurse Practitioners) who all work together to provide you with the care you need, when you need it. You will need  a follow up appointment in:  3 months.  You may see Dr. Acie Fredrickson or one of the following Advanced Practice Providers on your designated Care Team: Richardson Dopp, PA-C [place], [place] . Daune Perch, NP

## 2018-11-10 ENCOUNTER — Telehealth: Payer: Self-pay | Admitting: *Deleted

## 2018-11-10 DIAGNOSIS — O99212 Obesity complicating pregnancy, second trimester: Secondary | ICD-10-CM | POA: Diagnosis not present

## 2018-11-10 DIAGNOSIS — Z3A21 21 weeks gestation of pregnancy: Secondary | ICD-10-CM | POA: Diagnosis not present

## 2018-11-10 DIAGNOSIS — Z3402 Encounter for supervision of normal first pregnancy, second trimester: Secondary | ICD-10-CM | POA: Diagnosis not present

## 2018-11-10 DIAGNOSIS — Z363 Encounter for antenatal screening for malformations: Secondary | ICD-10-CM | POA: Diagnosis not present

## 2018-11-10 NOTE — Telephone Encounter (Signed)
[employer] to ship a 30 day cardiac event monitor to your home.  Instructions reviewed briefly as they are included in the monitor kit. 

## 2018-11-11 DIAGNOSIS — F411 Generalized anxiety disorder: Secondary | ICD-10-CM | POA: Diagnosis not present

## 2018-11-15 ENCOUNTER — Other Ambulatory Visit: Payer: Self-pay

## 2018-11-15 ENCOUNTER — Ambulatory Visit (HOSPITAL_COMMUNITY): Payer: BC Managed Care – PPO | Attending: Cardiology

## 2018-11-15 DIAGNOSIS — R42 Dizziness and giddiness: Secondary | ICD-10-CM | POA: Insufficient documentation

## 2018-11-15 DIAGNOSIS — R002 Palpitations: Secondary | ICD-10-CM | POA: Diagnosis not present

## 2018-11-16 ENCOUNTER — Ambulatory Visit (INDEPENDENT_AMBULATORY_CARE_PROVIDER_SITE_OTHER): Payer: BC Managed Care – PPO

## 2018-11-16 DIAGNOSIS — R002 Palpitations: Secondary | ICD-10-CM | POA: Diagnosis not present

## 2018-11-16 DIAGNOSIS — R42 Dizziness and giddiness: Secondary | ICD-10-CM

## 2018-11-18 ENCOUNTER — Telehealth: Payer: Self-pay | Admitting: Cardiovascular Disease

## 2018-11-18 NOTE — Telephone Encounter (Signed)
The patient called with some questions that came up after discussing some test results with a nurse. The patient is not experiencing any discomfort at the moment. She would just like some further clarification.

## 2018-11-18 NOTE — Telephone Encounter (Signed)
Pt wanted to review echo results again.All questions have been answered .Adonis Housekeeper

## 2018-11-28 ENCOUNTER — Telehealth: Payer: Self-pay

## 2018-11-28 NOTE — Telephone Encounter (Signed)
I called and left patient a message to verify medications for tomorrow's visit and to get consent.

## 2018-11-29 ENCOUNTER — Telehealth (INDEPENDENT_AMBULATORY_CARE_PROVIDER_SITE_OTHER): Payer: BC Managed Care – PPO | Admitting: Cardiovascular Disease

## 2018-11-29 ENCOUNTER — Other Ambulatory Visit: Payer: Self-pay

## 2018-11-29 VITALS — BP 137/92 | HR 98 | Ht 67.0 in | Wt 302.0 lb

## 2018-11-29 DIAGNOSIS — R002 Palpitations: Secondary | ICD-10-CM

## 2018-11-29 DIAGNOSIS — I10 Essential (primary) hypertension: Secondary | ICD-10-CM | POA: Diagnosis not present

## 2018-11-29 DIAGNOSIS — Z7189 Other specified counseling: Secondary | ICD-10-CM

## 2018-11-29 MED ORDER — LABETALOL HCL 300 MG PO TABS: 300.0000 mg | ORAL_TABLET | Freq: Two times a day (BID) | ORAL | 11 refills | Status: DC

## 2018-11-29 NOTE — Patient Instructions (Addendum)
Medication Instructions:  Your physician has recommended you make the following change in your medication:  INCREASE Labetalol to 300 mg twice daily  If you need a refill on your cardiac medications before your next appointment, please call your pharmacy.    Lab work: None Ordered    Testing/Procedures: None Ordered    Follow-Up: Your physician recommends that you keep your follow-up appointment on October 15 at 2:00 pm with Dr. Acie Fredrickson at our [facility] office

## 2018-11-29 NOTE — Progress Notes (Signed)
Virtual Visit via Video Note   This visit type was conducted due to national recommendations for restrictions regarding the COVID-19 Pandemic (e.g. social distancing) in an effort to limit this patient's exposure and mitigate transmission in our community.  Due to her co-morbid illnesses, this patient is at least at moderate risk for complications without adequate follow up.  This format is felt to be most appropriate for this patient at this time.  All issues noted in this document were discussed and addressed.  A limited physical exam was performed with this format.  Please refer to the patient's chart for her consent to telehealth for [facility].   Date:  11/29/2018   ID:  Sarah Giles, DOB 01/20/1991, MRN [id number]  Patient Location: Home Provider Location: Home  PCP:  Kristen Loader, FNP  Cardiologist:   Catori Panozzo  Electrophysiologist:  None    November 08, 2018    Sarah Giles is a 28 y.o. female with a hx of dyspnea , HTN  She is [redacted] weeks pregnant.   We are asked to see her today by Dr. brake for further evaluation of these palpitations and dizziness.  Changed from HCTZ to Labetalol with pregnancy    3 weeks ago, she woke up very short of breath. Couldn't breath, lightheadedness. Went to the ER  Work up was normal .  HR was very fast  Bought a pulse oxymeter  Was very nervous,   Occurs with sitting or lying down. ,  Feels great when standing   her OB has suggested hypoglycemia as a possible dx She has been eating more protein and feels   Has not been exercising  Works at [employer]  [employer]   This is 2nd pregnancy,   No complications with 1st pregnancy  Had marked HTN on day of delivery     Evaluation Performed:  Follow-Up Visit  Chief Complaint:  palpitations  History of Present Illness:    Sarah Giles is a 28 y.o. female with a histor of palpitations We ordered an event monitor  Echocardiogram reveals normal left ventricular  systolic function.  Her ejection fraction is 60 to 65%.  She has impaired diastolic relaxation.  Right ventricular size and function are normal. She is [redacted] weeks pregnant  She has noticed that when she has these palpitations, she will eat 1/2 protein bar and drink some water and will feel better.   BP and HR were a bit elevated this am but she did not get much sleep last night Eats [place] bacon 3 times a week Advised her to stop eating the [place] bacon   The patient does not have symptoms concerning for COVID-19 infection (fever, chills, cough, or new shortness of breath).    Past Medical History:  Diagnosis Date  . Allergy   . Gestational hypertension 04/27/2014  . Hypertension   . Medical history non-contributory   . Morbid obesity ([place]) 04/27/2014  . NSVD (normal spontaneous vaginal delivery) 04/28/2014   Past Surgical History:  Procedure Laterality Date  . BREAST SURGERY     2008     Current Meds  Medication Sig  . labetalol (NORMODYNE) 200 MG tablet Take 1 tablet (200 mg total) by mouth 2 (two) times daily.  . Prenatal Vit-Fe Fumarate-FA (PRENATAL MULTIVITAMIN) TABS tablet Take 1 tablet by mouth daily at 12 noon.     Allergies:   Patient has no known allergies.   Social History   Tobacco Use  .  Smoking status: Never Smoker  . Smokeless tobacco: Never Used  Substance Use Topics  . Alcohol use: Not Currently    Alcohol/week: 3.0 standard drinks    Types: 3 Standard drinks or equivalent per week  . Drug use: Not Currently     Family Hx: The patient's family history includes Diabetes in her father and paternal grandfather; Hypertension in her maternal grandmother and mother.  ROS:   Please see the history of present illness.     All other systems reviewed and are negative.   Prior CV studies:   The following studies were reviewed today:   Labs/Other Tests and Data Reviewed:    EKG:  No ECG reviewed.  Recent Labs: 09/14/2018: ALT 13; BUN 8; Creatinine, Ser  0.70; Hemoglobin 12.3; Platelets 318; Potassium 3.5; Sodium 138   Recent Lipid Panel No results found for: CHOL, TRIG, HDL, CHOLHDL, LDLCALC, LDLDIRECT  Wt Readings from Last 3 Encounters:  11/29/18 (!) 302 lb (137 kg)  11/08/18 (!) 302 lb 9.6 oz (137.3 kg)  09/14/18 297 lb (134.7 kg)     Objective:    Vital Signs:  BP (!) 142/84 (BP Location: Left Arm, Patient Position: Sitting, Cuff Size: Normal)   Pulse 98   Ht 5\' 7"  (1.702 m)   Wt (!) 302 lb (137 kg)   SpO2 99%   BMI 47.30 kg/m    VITAL SIGNS:  reviewed GEN:  no acute distress, morbidly obese.  EYES:  sclerae anicteric, EOMI - Extraocular Movements Intact RESPIRATORY:  normal respiratory effort, symmetric expansion CARDIOVASCULAR:  no peripheral edema SKIN:  no rash, lesions or ulcers. MUSCULOSKELETAL:  no obvious deformities. NEURO:  alert and oriented x 3, no obvious focal deficit PSYCH:  normal affect  ASSESSMENT & PLAN:    1. Palpitations:  Currently wearing a monitor  2.  Essential HTN:    Still eating some salty foods ( [place] bacon 3 times a week) needs to avoid salt.   Will increase the Labetalol to 300 mg BID.   She will continue to watch  3.   Morbid obesity:   We had a long discussion about her obesity and diastolic dysfunctoin.   I stressed the importance of weight loss so that she can avoid long term ill effects of morbid obesity.   She is committed to losing weightl   Will see in her in 2-3 months      COVID-19 Education: The signs and symptoms of COVID-19 were discussed with the patient and how to seek care for testing (follow up with PCP or arrange E-visit).  The importance of social distancing was discussed today.  Time:   Today, I have spent  29  minutes with the patient with telehealth technology discussing the above problems.     Medication Adjustments/Labs and Tests Ordered: Current medicines are reviewed at length with the patient today.  Concerns regarding medicines are outlined above.    Tests Ordered: No orders of the defined types were placed in this encounter.   Medication Changes: No orders of the defined types were placed in this encounter.   Follow Up:  In Person in 2 month(s)  Signed, Kristeen MissPhilip Adel Burch, MD  11/29/2018 9:37 AM    [facility]

## 2018-12-06 DIAGNOSIS — Z362 Encounter for other antenatal screening follow-up: Secondary | ICD-10-CM | POA: Diagnosis not present

## 2018-12-06 DIAGNOSIS — O99212 Obesity complicating pregnancy, second trimester: Secondary | ICD-10-CM | POA: Diagnosis not present

## 2018-12-06 DIAGNOSIS — Z3A25 25 weeks gestation of pregnancy: Secondary | ICD-10-CM | POA: Diagnosis not present

## 2018-12-23 ENCOUNTER — Telehealth: Payer: Self-pay | Admitting: Cardiovascular Disease

## 2018-12-23 NOTE — Telephone Encounter (Signed)
New Message   Patient is calling to speak with Dr. Acie Fredrickson. She had spoken with him earlier.

## 2018-12-26 ENCOUNTER — Other Ambulatory Visit: Payer: Self-pay

## 2018-12-26 ENCOUNTER — Inpatient Hospital Stay (HOSPITAL_COMMUNITY)
Admission: AD | Admit: 2018-12-26 | Discharge: 2018-12-26 | Disposition: A | Discharge status: Home / Self Care | Payer: BC Managed Care – PPO | Attending: Obstetrics & Gynecology | Admitting: Obstetrics & Gynecology

## 2018-12-26 ENCOUNTER — Encounter: Payer: Self-pay | Admitting: Obstetrics and Gynecology

## 2018-12-26 ENCOUNTER — Encounter (HOSPITAL_COMMUNITY): Payer: Self-pay

## 2018-12-26 ENCOUNTER — Telehealth: Payer: Self-pay | Admitting: Cardiovascular Disease

## 2018-12-26 DIAGNOSIS — O99343 Other mental disorders complicating pregnancy, third trimester: Secondary | ICD-10-CM | POA: Insufficient documentation

## 2018-12-26 DIAGNOSIS — Z8249 Family history of ischemic heart disease and other diseases of the circulatory system: Secondary | ICD-10-CM | POA: Insufficient documentation

## 2018-12-26 DIAGNOSIS — Z8679 Personal history of other diseases of the circulatory system: Secondary | ICD-10-CM | POA: Insufficient documentation

## 2018-12-26 DIAGNOSIS — Z79899 Other long term (current) drug therapy: Secondary | ICD-10-CM | POA: Insufficient documentation

## 2018-12-26 DIAGNOSIS — O163 Unspecified maternal hypertension, third trimester: Secondary | ICD-10-CM | POA: Insufficient documentation

## 2018-12-26 DIAGNOSIS — Z3A28 28 weeks gestation of pregnancy: Secondary | ICD-10-CM

## 2018-12-26 DIAGNOSIS — O10913 Unspecified pre-existing hypertension complicating pregnancy, third trimester: Secondary | ICD-10-CM | POA: Diagnosis not present

## 2018-12-26 DIAGNOSIS — Z833 Family history of diabetes mellitus: Secondary | ICD-10-CM | POA: Diagnosis not present

## 2018-12-26 DIAGNOSIS — F419 Anxiety disorder, unspecified: Secondary | ICD-10-CM

## 2018-12-26 DIAGNOSIS — Z3689 Encounter for other specified antenatal screening: Secondary | ICD-10-CM

## 2018-12-26 DIAGNOSIS — I10 Essential (primary) hypertension: Secondary | ICD-10-CM | POA: Diagnosis present

## 2018-12-26 LAB — PROTEIN / CREATININE RATIO, URINE
Creatinine, Urine: 12.71 mg/dL
Total Protein, Urine: <6 mg/dL

## 2018-12-26 LAB — URINALYSIS, ROUTINE W REFLEX MICROSCOPIC
Bacteria, UA: NONE SEEN
Bilirubin Urine: NEGATIVE
Glucose, UA: NEGATIVE mg/dL
Ketones, ur: NEGATIVE mg/dL
Leukocytes,Ua: NEGATIVE
Nitrite: NEGATIVE
Protein, ur: NEGATIVE mg/dL
Specific Gravity, Urine: 1.001 — ABNORMAL LOW (ref 1.005–1.030)
pH: 7 (ref 5.0–8.0)

## 2018-12-26 MED ORDER — LABETALOL HCL 200 MG PO TABS: 400.0000 mg | ORAL_TABLET | Freq: Two times a day (BID) | ORAL | 11 refills | Status: DC

## 2018-12-26 MED ORDER — HYDROXYZINE HCL 25 MG PO TABS
25.0000 mg | ORAL_TABLET | Freq: Once | ORAL | Status: AC
  Administered 2018-12-26: 25 mg via ORAL
  Filled 2018-12-26: qty 1

## 2018-12-26 NOTE — Discharge Instructions (Signed)

## 2018-12-26 NOTE — Addendum Note (Signed)
Addended by: Emmaline Life on: 12/26/2018 05:19 PM   Modules accepted: Orders

## 2018-12-26 NOTE — MAU Note (Signed)
States she has had elevated Bps for the past 2 hours-took her last labetalol at 2200.  States she is feeling more out of breath with this pregnancy.  Reports earlier today she had a headache and wasn't feeling well.  Had a workup w/ cardiology and everything was normal except one episode of tachycardia.  Bps tonight were diastolic 83A-919T.    No H/A right now.

## 2018-12-26 NOTE — MAU Provider Note (Signed)
History     CSN: [id number]  Arrival date and time: 12/26/18 [id number]   First Provider Initiated Contact with Patient 12/26/18 0247      Chief Complaint  Patient presents with  . Hypertension   Sarah Giles is a 28 y.o. G2P1001 at 3477w0d [facility]  She presents today for Hypertension.  She provides an extensive unsolicited back story regarding her need for a MRI as soon as provider comes into the room. After provider redirects, patient states that she took her blood pressure after experiencing some symptoms.  She reports taking Labetalol 300mg  BID and states that  she took her night time dose at 10pm.  She reports that she "had a pretty bad headache at midnight" and took her blood pressure.  She rated the headache as a 7/10, but did not take any medication and it has since resolved.  Patient reports having fluctuations in her blood pressure as she took it 3x in 30 minutes and then twice in the hour following that.  She states the systolic numbers remained normal, but the diastolic became elevated ranging from 90-106.  She states she took it yesterday due to a feeling of lightheadedness and it was a normal systolic, but the diastolic was 115, but both were normal 25 minutes later.  She endorses fetal movement and denies contractions and vaginal concerns including discharge, bleeding, or leaking. Patient goes on to report that she has "had a very stressful weekend" since receiving the MRI news from her cardiologist.  Patient does endorse a history of anxiety, but states she has never received treatment for it.      OB History    Gravida  2   Para  1   Term  1   Preterm      AB      Living  1     SAB      TAB      Ectopic      Multiple  0   Live Births  1           Past Medical History:  Diagnosis Date  . Allergy   . Gestational hypertension 04/27/2014  . Hypertension   . Medical history non-contributory   . Morbid obesity (HCC) 04/27/2014  . NSVD (normal  spontaneous vaginal delivery) 04/28/2014    Past Surgical History:  Procedure Laterality Date  . BREAST SURGERY     2008    Family History  Problem Relation Age of Onset  . Hypertension Mother   . Diabetes Father   . Hypertension Maternal Grandmother   . Diabetes Paternal Grandfather     Social History   Tobacco Use  . Smoking status: Never Smoker  . Smokeless tobacco: Never Used  Substance Use Topics  . Alcohol use: Not Currently    Alcohol/week: 3.0 standard drinks    Types: 3 Standard drinks or equivalent per week  . Drug use: Not Currently    Allergies: No Known Allergies  Medications Prior to Admission  Medication Sig Dispense Refill Last Dose  . labetalol (NORMODYNE) 300 MG tablet Take 1 tablet (300 mg total) by mouth 2 (two) times daily. 60 tablet 11 12/25/2018 at 2200  . Prenatal Vit-Fe Fumarate-FA (PRENATAL MULTIVITAMIN) TABS tablet Take 1 tablet by mouth daily at 12 noon.       Review of Systems  Constitutional: Negative for chills and fever.  Eyes: Negative for visual disturbance.  Respiratory: Positive for shortness of breath. Negative for  cough.   Gastrointestinal: Negative for abdominal pain, constipation, diarrhea, nausea and vomiting.  Genitourinary: Negative for difficulty urinating, dysuria, vaginal bleeding and vaginal discharge.  Musculoskeletal: Negative for back pain.  Neurological: Negative for dizziness, light-headedness and headaches.   Physical Exam   Blood pressure 120/88, pulse 98, temperature 99.1 F (37.3 C), temperature source Oral, resp. rate 17, weight 134.5 kg. Vitals:   12/26/18 0312 12/26/18 0322  BP: (!) 134/92 115/75  Pulse: 88 86  Resp:    Temp:    SpO2:  99%    Physical Exam  Constitutional: She is oriented to person, place, and time. She appears well-developed and well-nourished.  HENT:  Head: Normocephalic and atraumatic.  Eyes: Conjunctivae are normal.  Neck: Normal range of motion.  Cardiovascular: Normal rate  and normal heart sounds.  Respiratory: Effort normal.  Musculoskeletal: Normal range of motion.  Neurological: She is alert and oriented to person, place, and time.  Skin: Skin is warm and dry.  Psychiatric: She has a normal mood and affect. Her behavior is normal.    Fetal Assessment 135 bpm, Mod Var, -Decels, +10x10 Accels Toco: None graphed  MAU Course   Results for orders placed or performed during the hospital encounter of 12/26/18 (from the past 24 hour(s))  Urinalysis, Routine w reflex microscopic     Status: Abnormal   Collection Time: 12/26/18  2:42 AM  Result Value Ref Range   Color, Urine COLORLESS (A) YELLOW   APPearance CLEAR CLEAR   Specific Gravity, Urine 1.001 (L) 1.005 - 1.030   pH 7.0 5.0 - 8.0   Glucose, UA NEGATIVE NEGATIVE mg/dL   Hgb urine dipstick SMALL (A) NEGATIVE   Bilirubin Urine NEGATIVE NEGATIVE   Ketones, ur NEGATIVE NEGATIVE mg/dL   Protein, ur NEGATIVE NEGATIVE mg/dL   Nitrite NEGATIVE NEGATIVE   Leukocytes,Ua NEGATIVE NEGATIVE   WBC, UA 0-5 0 - 5 WBC/hpf   Bacteria, UA NONE SEEN NONE SEEN  Protein / creatinine ratio, urine     Status: None   Collection Time: 12/26/18  2:42 AM  Result Value Ref Range   Creatinine, Urine 12.71 mg/dL   Total Protein, Urine <6 mg/dL   Protein Creatinine Ratio        0.00 - 0.15 mg/mg[Cre]   No results found.  MDM PE Labs:UA, PC Ratio EFM Anxiety Medication Assessment and Plan  28 year old G2P1001  SIUP at 28weeks Cat I FT Chronic Hypertension Anxiety  -Exam findings discussed. -Reassured that blood pressures (despite one elevation during discussion with provider) not significant for PreEClampsia. -Patient agrees with provider assessment.  -Discussed PreEclampsia workup and patient declines complete workup, but requests PC Ratio. -Extensive discussion regarding apparent anxiety and need for treatment. -Encouraged to not worry about need for repeat MRI as this should be discussed with primary ob and  cardiologist.  -Patient agreeable to medication now, but requests lowest possible dose. -Will give Vistaril 25mg . -Encouraged to inform primary ob if she feels vistaril helped and if not have a discussion about some type of treatment be it therapy or medication mgmt.  -Patient reports next appt is scheduled for Tuesday Sept 1st with Dr. Alwyn Pea.  -NST reactive can discontinue monitoring. -Will reassess after results return.   Maryann Conners MSN, CNM 12/26/2018, 2:48 AM   Reassessment (4:19 AM) CHTN  -PC Ratio returns too low to calculate. -In room to discuss results and patient with questions. -Patient reports feeling "fine." -Patient encouraged to follow up with primary ob and to  discuss symptoms of anxiety including panic attacks. -Encouraged to call primary ob or return to MAU if symptoms worsen or with the onset of new symptoms. -No questions or concerns. -Discharged to home in stable condition.  Cherre Robins MSN, CNM  Reassessment (4:41 AM) Vitals:   12/26/18 0403 12/26/18 0432  BP: 122/65 (!) 149/76  Pulse: 86   Resp:  16  Temp:  97.9 F (36.6 C)  SpO2:      -Nurse reports patient with elevated discharge bp x2 and instructed to have patient sit at side of bed and retake in 15 minutes.  Reassessment 0455 -Repeat blood pressure 131/85. -Okay to discharge.   Cherre Robins MSN, CNM

## 2018-12-26 NOTE — Telephone Encounter (Signed)
I have heard back from Sarah Giles and discussed further with Dr. Cristopher Giles ( EP)  Sarah Giles would not want to give Sarah Giles and the MRI would not provide [place] with the information needed without the Gad.  We discussed the 2 options remaining  1.   Continue treatment with Labetalol ( with an increased dose of 400 mg BID) and continue to watch for symptoms .  She has significantly improved her lifestyle ( eating less salt and less fats) .   If she has any syncope, then we should consider further eval and LifeVest.  2.   Fitting her for a Lifevest now.  We discussed in detail  She is feeling much better and is having fewer palpitations after increasing the Labetolol from 200 BID to 300 BID She was in the ER this AM with HTN We will increase the Labetolol to 400 mg PO BID And continue to follow   We can plan on getting the cardiac MRI after she delivers   I think it would be reasonable to have her followed closely in the [facility] clinic ( ? Weekly visits ) going forward. I will see her in follow up in Oct. - sooner if she has any symptoms .     Mertie Moores, MD  12/26/2018 5:03 PM    [facility] [place],  [place],   [address] Pager [phone] Phone: [phone]; Fax: [phone]

## 2018-12-28 DIAGNOSIS — Z23 Encounter for immunization: Secondary | ICD-10-CM | POA: Diagnosis not present

## 2018-12-28 DIAGNOSIS — Z369 Encounter for antenatal screening, unspecified: Secondary | ICD-10-CM | POA: Diagnosis not present

## 2019-01-10 DIAGNOSIS — O99213 Obesity complicating pregnancy, third trimester: Secondary | ICD-10-CM | POA: Diagnosis not present

## 2019-01-10 DIAGNOSIS — Z3A30 30 weeks gestation of pregnancy: Secondary | ICD-10-CM | POA: Diagnosis not present

## 2019-01-11 DIAGNOSIS — Z3403 Encounter for supervision of normal first pregnancy, third trimester: Secondary | ICD-10-CM | POA: Diagnosis not present

## 2019-01-11 DIAGNOSIS — O99213 Obesity complicating pregnancy, third trimester: Secondary | ICD-10-CM | POA: Diagnosis not present

## 2019-01-23 DIAGNOSIS — Z3A32 32 weeks gestation of pregnancy: Secondary | ICD-10-CM | POA: Diagnosis not present

## 2019-01-23 DIAGNOSIS — O99213 Obesity complicating pregnancy, third trimester: Secondary | ICD-10-CM | POA: Diagnosis not present

## 2019-01-26 DIAGNOSIS — F411 Generalized anxiety disorder: Secondary | ICD-10-CM | POA: Diagnosis not present

## 2019-01-30 DIAGNOSIS — Z3403 Encounter for supervision of normal first pregnancy, third trimester: Secondary | ICD-10-CM | POA: Diagnosis not present

## 2019-01-30 DIAGNOSIS — Z3A33 33 weeks gestation of pregnancy: Secondary | ICD-10-CM | POA: Diagnosis not present

## 2019-01-30 DIAGNOSIS — O99213 Obesity complicating pregnancy, third trimester: Secondary | ICD-10-CM | POA: Diagnosis not present

## 2019-02-02 DIAGNOSIS — F411 Generalized anxiety disorder: Secondary | ICD-10-CM | POA: Diagnosis not present

## 2019-02-06 ENCOUNTER — Telehealth: Payer: Self-pay | Admitting: Cardiovascular Disease

## 2019-02-06 NOTE — Telephone Encounter (Signed)
Spoke with patient who states she has no new complaints, feels well on current dose of Labetalol. She reports BP readings at home are 226-333 systolic and 80 diastolic. She would like to have a virtual visit with Dr. Acie Fredrickson this week instead of a clinic visit. I scheduled her for a virtual appointment on Wednesday 10/14. She verbalized agreement and thanked me for the call.

## 2019-02-06 NOTE — Telephone Encounter (Signed)
New Message  Patient called in to see if the appointment that is scheduled for 02/09/19 at 2:00 pm can be changed to a virtual visit on the same day at the same time due to her being [redacted] weeks pregnant. Please give patient a call back to confirm.  Patient states if appointment can not be changed to a virtual visit she will just leave appointment as is.

## 2019-02-07 DIAGNOSIS — Z3A34 34 weeks gestation of pregnancy: Secondary | ICD-10-CM | POA: Diagnosis not present

## 2019-02-07 DIAGNOSIS — O99213 Obesity complicating pregnancy, third trimester: Secondary | ICD-10-CM | POA: Diagnosis not present

## 2019-02-08 ENCOUNTER — Telehealth (INDEPENDENT_AMBULATORY_CARE_PROVIDER_SITE_OTHER): Payer: BC Managed Care – PPO | Admitting: Cardiovascular Disease

## 2019-02-08 ENCOUNTER — Other Ambulatory Visit: Payer: Self-pay

## 2019-02-08 ENCOUNTER — Encounter: Payer: Self-pay | Admitting: Cardiovascular Disease

## 2019-02-08 DIAGNOSIS — I10 Essential (primary) hypertension: Secondary | ICD-10-CM

## 2019-02-08 DIAGNOSIS — Z7189 Other specified counseling: Secondary | ICD-10-CM

## 2019-02-08 NOTE — Progress Notes (Signed)
Virtual Visit via Video Note   This visit type was conducted due to national recommendations for restrictions regarding the COVID-19 Pandemic (e.g. social distancing) in an effort to limit this patient's exposure and mitigate transmission in our community.  Due to her co-morbid illnesses, this patient is at least at moderate risk for complications without adequate follow up.  This format is felt to be most appropriate for this patient at this time.  All issues noted in this document were discussed and addressed.  A limited physical exam was performed with this format.  Please refer to the patient's chart for her consent to telehealth for [facility].   Date:  02/08/2019   ID:  [name], DOB [birth year], MRN [id number]  Patient Location: Home Provider Location: Home  PCP:  Kristen Loader, FNP  Cardiologist:    Ryn Peine  Electrophysiologist:  None   Evaluation Performed:  Follow-Up Visit  Chief Complaint:  HTN  History of Present Illness:    ARLEENE SETTLE is a 28 y.o. female with obesiy, HTN, palpitations and pregnancy.  Pregnancy is going well.   Going to be induced at 38 weeks ( Nov. 9)  BP is great this am 117/85 Went to the [facility] doctor yesterday  BP was 105/60 Became lightheaded toward the evening . Is on ASA 81 mg a day to help prevent preeclampsia No leg swelling .  Avoid salt Recently moved to [place].   Walks several times a week.   Working virtually  Is a Physiological scientist at [employer].  is hydrating better.  No further palpitations  Echo showed normal LV systolic function. She has grade 1 diastolic dysfunction       The patient does not have symptoms concerning for COVID-19 infection (fever, chills, cough, or new shortness of breath).    Past Medical History:  Diagnosis Date  . Allergy   . Gestational hypertension 04/27/2014  . Hypertension   . Medical history non-contributory   . Morbid obesity ([place]) 04/27/2014  . NSVD (normal spontaneous  vaginal delivery) 04/28/2014   Past Surgical History:  Procedure Laterality Date  . BREAST SURGERY     2008     Current Meds  Medication Sig  . aspirin EC 81 MG tablet Take 81 mg by mouth daily.  Marland Kitchen labetalol (NORMODYNE) 200 MG tablet Take 2 tablets (400 mg total) by mouth 2 (two) times daily.  . Prenatal Vit-Fe Fumarate-FA (PRENATAL MULTIVITAMIN) TABS tablet Take 1 tablet by mouth daily at 12 noon.     Allergies:   Patient has no known allergies.   Social History   Tobacco Use  . Smoking status: Never Smoker  . Smokeless tobacco: Never Used  Substance Use Topics  . Alcohol use: Not Currently    Alcohol/week: 3.0 standard drinks    Types: 3 Standard drinks or equivalent per week  . Drug use: Not Currently     Family Hx: The patient's family history includes Diabetes in her father and paternal grandfather; Hypertension in her maternal grandmother and mother.  ROS:   Please see the history of present illness.     All other systems reviewed and are negative.   Prior CV studies:   The following studies were reviewed today:    Labs/Other Tests and Data Reviewed:    EKG:  No ECG reviewed.  Recent Labs: 09/14/2018: ALT 13; BUN 8; Creatinine, Ser 0.70; Hemoglobin 12.3; Platelets 318; Potassium 3.5; Sodium 138   Recent Lipid Panel No results  found for: CHOL, TRIG, HDL, CHOLHDL, LDLCALC, LDLDIRECT  Wt Readings from Last 3 Encounters:  02/08/19 296 lb (134.3 kg)  12/26/18 296 lb 9 oz (134.5 kg)  11/29/18 (!) 302 lb (137 kg)     Objective:    Vital Signs:  BP 106/73 (BP Location: Left Arm, Patient Position: Sitting, Cuff Size: Normal)   Pulse 87   Ht 5\' 7"  (1.702 m)   Wt 296 lb (134.3 kg)   BMI 46.36 kg/m    VITAL SIGNS:  reviewed GEN:  no acute distress EYES:  sclerae anicteric, EOMI - Extraocular Movements Intact RESPIRATORY:  normal respiratory effort, symmetric expansion CARDIOVASCULAR:  no peripheral edema SKIN:  no rash, lesions or ulcers.  MUSCULOSKELETAL:  no obvious deformities. NEURO:  alert and oriented x 3, no obvious focal deficit PSYCH:  normal affect  ASSESSMENT & PLAN:    1. HTN:  BP is well controlled.   Continue same meds.   It's possible that she will be able to reduce her BP meds once she delivers.    Ive encouraged her to work on a better diet, exercise, weight loss program.  She is committed on doing this   2. Chronic diastolic dysfunction:   Advised weight loss and BP control.     COVID-19 Education: The signs and symptoms of COVID-19 were discussed with the patient and how to seek care for testing (follow up with PCP or arrange E-visit).  The importance of social distancing was discussed today.  Time:   Today, I have spent  18  minutes with the patient with telehealth technology discussing the above problems.     Medication Adjustments/Labs and Tests Ordered: Current medicines are reviewed at length with the patient today.  Concerns regarding medicines are outlined above.   Tests Ordered: No orders of the defined types were placed in this encounter.   Medication Changes: No orders of the defined types were placed in this encounter.   Follow Up:  In Person in 4 month(s)  Signed, , MD  02/08/2019 8:17 AM    [facility]

## 2019-02-08 NOTE — Patient Instructions (Addendum)
Medication Instructions:  Your physician recommends that you continue on your current medications as directed. Please refer to the Current Medication list given to you today.  If you need a refill on your cardiac medications before your next appointment, please call your pharmacy.    Lab work: None Ordered    Testing/Procedures: None Ordered   Follow-Up: At [facility], you and your health needs are our priority.  As part of our continuing mission to provide you with exceptional heart care, we have created designated Provider Care Teams.  These Care Teams include your primary Cardiologist (physician) and Advanced Practice Providers (APPs -  Physician Assistants and Nurse Practitioners) who all work together to provide you with the care you need, when you need it. You will need a follow up appointment in:  4 months.  Please call our office to schedule this appointment when you receive the letter in the mail.  You may see Dr. Acie Fredrickson or one of the following Advanced Practice Providers on your designated Care Team: Richardson Dopp, PA-C [place], [place] . Daune Perch, NP

## 2019-02-09 ENCOUNTER — Ambulatory Visit: Payer: BC Managed Care – PPO | Admitting: Cardiovascular Disease

## 2019-02-10 DIAGNOSIS — F411 Generalized anxiety disorder: Secondary | ICD-10-CM | POA: Diagnosis not present

## 2019-02-15 DIAGNOSIS — Z3A35 35 weeks gestation of pregnancy: Secondary | ICD-10-CM | POA: Diagnosis not present

## 2019-02-15 DIAGNOSIS — O99891 Other specified diseases and conditions complicating pregnancy: Secondary | ICD-10-CM | POA: Diagnosis not present

## 2019-02-15 DIAGNOSIS — O99213 Obesity complicating pregnancy, third trimester: Secondary | ICD-10-CM | POA: Diagnosis not present

## 2019-02-16 ENCOUNTER — Encounter (HOSPITAL_COMMUNITY): Payer: Self-pay | Admitting: *Deleted

## 2019-02-16 ENCOUNTER — Inpatient Hospital Stay (HOSPITAL_COMMUNITY)
Admission: AD | Admit: 2019-02-16 | Discharge: 2019-02-16 | Disposition: A | Discharge status: Home / Self Care | Payer: BC Managed Care – PPO | Attending: Obstetrics and Gynecology | Admitting: Obstetrics and Gynecology

## 2019-02-16 ENCOUNTER — Other Ambulatory Visit: Payer: Self-pay

## 2019-02-16 DIAGNOSIS — Z3A35 35 weeks gestation of pregnancy: Secondary | ICD-10-CM | POA: Insufficient documentation

## 2019-02-16 DIAGNOSIS — O3660X0 Maternal care for excessive fetal growth, unspecified trimester, not applicable or unspecified: Secondary | ICD-10-CM | POA: Insufficient documentation

## 2019-02-16 DIAGNOSIS — M79651 Pain in right thigh: Secondary | ICD-10-CM | POA: Diagnosis not present

## 2019-02-16 DIAGNOSIS — O26893 Other specified pregnancy related conditions, third trimester: Secondary | ICD-10-CM | POA: Diagnosis not present

## 2019-02-16 DIAGNOSIS — Z7982 Long term (current) use of aspirin: Secondary | ICD-10-CM | POA: Insufficient documentation

## 2019-02-16 DIAGNOSIS — M791 Myalgia, unspecified site: Secondary | ICD-10-CM | POA: Insufficient documentation

## 2019-02-16 NOTE — MAU Note (Signed)
Sarah Giles is a 28 y.o. at [redacted]w[redacted]d here in MAU reporting right thigh pain and right ankle swelling.  Patient first noticed the pain last night and feels that it has not improved today.  Pt is concerned about blood clot and wanted to get checked out.  No c/o CTX or vaginal bleeding/discharge.  Reports good fetal movement.

## 2019-02-16 NOTE — MAU Provider Note (Signed)
Patient Sarah Giles is a 28 y.o. G2P1001 At [redacted]w[redacted]d here with complaints of leg pin and swollen feet. She denies HA, blurry vision, floating spots, RUQ pain.   She denies vaginal bleeding, decreased fetal movements, vaginal discharge, contractions, LOF. She denies dysuria, nausea/vomiting.  History     CSN: [id number]  Arrival date and time: 02/16/19 1645   None     Chief Complaint  Patient presents with  . Leg Pain   HPI Patient states that she felt pain in her upper right thigh yesterday at at noon. She had a OB appointment at 2, and she did not mention it then. That afternoon, she stretched out on the floor, and it helped a little bit. Pain was a 5/10; it went away completely for a few hours after stretching. Then today she felt the same aching feeling today on her outer right thigh. She also noticed swollen right on the top of her foot/toes and ankle. The swelling was on the top of her right foot. Her left foot and ankle were not swollen. This did not happen in her first pregnnacy.  She was sitting a lot today, was able to elevate it a little bit today. She took a nap this afternoon, when she woke up she "still kind of saw it". She called [facility], and they told her to come in for evaluation. Right now it looks the "least puffy" it has looked all day.  OB History    Gravida  2   Para  1   Term  1   Preterm      AB      Living  1     SAB      TAB      Ectopic      Multiple  0   Live Births  1           Past Medical History:  Diagnosis Date  . Allergy   . Gestational hypertension 04/27/2014  . Hypertension   . Medical history non-contributory   . Morbid obesity (HCC) 04/27/2014  . NSVD (normal spontaneous vaginal delivery) 04/28/2014    Past Surgical History:  Procedure Laterality Date  . BREAST SURGERY     2008    Family History  Problem Relation Age of Onset  . Hypertension Mother   . Diabetes Father   . Hypertension Maternal Grandmother   . Diabetes  Paternal Grandfather     Social History   Tobacco Use  . Smoking status: Never Smoker  . Smokeless tobacco: Never Used  Substance Use Topics  . Alcohol use: Not Currently    Alcohol/week: 3.0 standard drinks    Types: 3 Standard drinks or equivalent per week  . Drug use: Never    Allergies: No Known Allergies  Medications Prior to Admission  Medication Sig Dispense Refill Last Dose  . aspirin EC 81 MG tablet Take 81 mg by mouth daily.   02/16/2019 at Unknown time  . labetalol (NORMODYNE) 200 MG tablet Take 2 tablets (400 mg total) by mouth 2 (two) times daily. 120 tablet 11 02/16/2019 at Unknown time  . omega-3 acid ethyl esters (LOVAZA) 1 g capsule Take by mouth 2 (two) times daily.     . Omega-3 Fatty Acids (OMEGA 3 500 PO) Take by mouth. Unsure of dosage     . Prenatal Vit-Fe Fumarate-FA (PRENATAL MULTIVITAMIN) TABS tablet Take 1 tablet by mouth daily at 12 noon.   02/16/2019 at Unknown time    Review  of Systems  Constitutional: Negative.   HENT: Negative.   Respiratory: Negative.   Cardiovascular: Negative.   Gastrointestinal: Negative.   Genitourinary: Negative.   Musculoskeletal: Negative.   Neurological: Negative.   Hematological: Negative.    Physical Exam   Blood pressure 134/73, pulse 96, temperature 98.3 F (36.8 C), temperature source Oral, resp. rate 20, weight 135.2 kg.  Physical Exam  Constitutional: She appears well-developed and well-nourished.  HENT:  Head: Normocephalic.  Neck: Normal range of motion.  Respiratory: Effort normal.  GI: Soft.  Musculoskeletal: Normal range of motion.  Neurological: She is alert.  Skin: Skin is warm and dry.  Both feet appear non-edematous; both legs are cool to the touch with no hot spots, no areas of redness or tenderness.  Patient is ambulating comfortably in the room.  MAU Course  Procedures  MDM NST: 135 bpm, mod var, present acel, neg decels, no contractions. -Low index of suspicion for DVT as patient's  extremities are cool to touch, non-tender, non-swollen without redness.  -Patient declined Flexeril.   Assessment and Plan   1. Muscle pain    2. Reviewed with patient that her pain is most likely MSK, and that she can try heat and rest for comfort. Given that the pain is not constant, and that she doesn't have any signs of DVT, low index of suspicion for DVT.  Reassured her that swelling in ankles is normal, this is a normal finding in late pregnancy.  3. Patient to keep appt next week with [facility].   Mervyn Skeeters  02/16/2019, 5:35 PM

## 2019-02-16 NOTE — Discharge Instructions (Signed)
Musculoskeletal Pain Musculoskeletal pain refers to aches and pains in your bones, joints, muscles, and the tissues that surround them. This pain can occur in any part of the body. It can last for a short time (acute) or a long time (chronic). A physical exam, lab tests, and imaging studies may be done to find the cause of your musculoskeletal pain. Follow these instructions at home:  Lifestyle  Try to control or lower your stress levels. Stress increases muscle tension and can worsen musculoskeletal pain. It is important to recognize when you are anxious or stressed and learn ways to manage it. This may include: ? Meditation or yoga. ? Cognitive or behavioral therapy. ? Acupuncture or massage therapy.  You may continue all activities unless the activities cause more pain. When the pain gets better, slowly resume your normal activities. Gradually increase the intensity and duration of your activities or exercise. Managing pain, stiffness, and swelling  Take over-the-counter and prescription medicines only as told by your health care provider.  When your pain is severe, bed rest may be helpful. Lie or sit in any position that is comfortable, but get out of bed and walk around at least every couple of hours.  If directed, apply heat to the affected area as often as told by your health care provider. Use the heat source that your health care provider recommends, such as a moist heat pack or a heating pad. ? Place a towel between your skin and the heat source. ? Leave the heat on for 20-30 minutes. ? Remove the heat if your skin turns bright red. This is especially important if you are unable to feel pain, heat, or cold. You may have a greater risk of getting burned.  If directed, put ice on the painful area. ? Put ice in a plastic bag. ? Place a towel between your skin and the bag. ? Leave the ice on for 20 minutes, 2-3 times a day. General instructions  Your health care provider may  recommend that you see a physical therapist. This person can help you come up with a safe exercise program. Do any exercises as told by your physical therapist.  Keep all follow-up visits, including any physical therapy visits, as told by your health care providers. This is important. Contact a health care provider if:  Your pain gets worse.  Medicines do not help ease your pain.  You cannot use the part of your body that hurts, such as your arm, leg, or neck.  You have trouble sleeping.  You have trouble doing your normal activities. Get help right away if:  You have a new injury and your pain is worse or different.  You feel numb or you have tingling in the painful area. Summary  Musculoskeletal pain refers to aches and pains in your bones, joints, muscles, and the tissues that surround them.  This pain can occur in any part of the body.  Your health care provider may recommend that you see a physical therapist. This person can help you come up with a safe exercise program. Do any exercises as told by your physical therapist.  Lower your stress level. Stress can worsen musculoskeletal pain. Ways to lower stress may include meditation, yoga, cognitive or behavioral therapy, acupuncture, and massage therapy. This information is not intended to replace advice given to you by your health care provider. Make sure you discuss any questions you have with your health care provider. Document Released: 04/13/2005 Document Revised: 03/26/2017 Document Reviewed:   05/13/2016 Elsevier Patient Education  2020 [employer]vier Inc.  

## 2019-02-20 DIAGNOSIS — Z3A36 36 weeks gestation of pregnancy: Secondary | ICD-10-CM | POA: Diagnosis not present

## 2019-02-20 DIAGNOSIS — O99213 Obesity complicating pregnancy, third trimester: Secondary | ICD-10-CM | POA: Diagnosis not present

## 2019-02-21 ENCOUNTER — Encounter (HOSPITAL_COMMUNITY): Payer: Self-pay | Admitting: *Deleted

## 2019-02-21 ENCOUNTER — Telehealth (HOSPITAL_COMMUNITY): Payer: Self-pay | Admitting: *Deleted

## 2019-02-21 DIAGNOSIS — F411 Generalized anxiety disorder: Secondary | ICD-10-CM | POA: Diagnosis not present

## 2019-02-21 NOTE — Telephone Encounter (Signed)
Preadmission screen  

## 2019-03-01 DIAGNOSIS — O99213 Obesity complicating pregnancy, third trimester: Secondary | ICD-10-CM | POA: Diagnosis not present

## 2019-03-01 DIAGNOSIS — I10 Essential (primary) hypertension: Secondary | ICD-10-CM | POA: Diagnosis not present

## 2019-03-01 DIAGNOSIS — Z3A37 37 weeks gestation of pregnancy: Secondary | ICD-10-CM | POA: Diagnosis not present

## 2019-03-02 ENCOUNTER — Other Ambulatory Visit: Payer: Self-pay | Admitting: Obstetrics and Gynecology

## 2019-03-04 ENCOUNTER — Other Ambulatory Visit (HOSPITAL_COMMUNITY)
Admission: RE | Admit: 2019-03-04 | Discharge: 2019-03-04 | Disposition: A | Discharge status: Home / Self Care | Payer: BC Managed Care – PPO | Source: Ambulatory Visit | Attending: Obstetrics and Gynecology | Admitting: Obstetrics and Gynecology

## 2019-03-04 ENCOUNTER — Other Ambulatory Visit: Payer: Self-pay

## 2019-03-04 DIAGNOSIS — Z20828 Contact with and (suspected) exposure to other viral communicable diseases: Secondary | ICD-10-CM | POA: Diagnosis not present

## 2019-03-04 DIAGNOSIS — Z01812 Encounter for preprocedural laboratory examination: Secondary | ICD-10-CM | POA: Insufficient documentation

## 2019-03-04 LAB — SARS CORONAVIRUS 2 (TAT 6-24 HRS): SARS Coronavirus 2: NEGATIVE

## 2019-03-04 NOTE — MAU Note (Signed)
Pt here for PAT covid swab. Denies symptoms. Swab collected.  

## 2019-03-06 ENCOUNTER — Inpatient Hospital Stay (HOSPITAL_COMMUNITY)
Admission: AD | Admit: 2019-03-06 | Discharge: 2019-03-08 | DRG: 806 | Disposition: A | Discharge status: Home / Self Care | Payer: BC Managed Care – PPO | Attending: Obstetrics & Gynecology | Admitting: Obstetrics & Gynecology

## 2019-03-06 ENCOUNTER — Encounter (HOSPITAL_COMMUNITY): Payer: Self-pay | Admitting: *Deleted

## 2019-03-06 ENCOUNTER — Inpatient Hospital Stay (HOSPITAL_COMMUNITY): Payer: BC Managed Care – PPO

## 2019-03-06 ENCOUNTER — Inpatient Hospital Stay (HOSPITAL_COMMUNITY): Payer: BC Managed Care – PPO | Admitting: Anesthesiology

## 2019-03-06 ENCOUNTER — Other Ambulatory Visit: Payer: Self-pay

## 2019-03-06 DIAGNOSIS — Z3A38 38 weeks gestation of pregnancy: Secondary | ICD-10-CM | POA: Diagnosis not present

## 2019-03-06 DIAGNOSIS — O10013 Pre-existing essential hypertension complicating pregnancy, third trimester: Secondary | ICD-10-CM | POA: Diagnosis not present

## 2019-03-06 DIAGNOSIS — O99824 Streptococcus B carrier state complicating childbirth: Secondary | ICD-10-CM | POA: Diagnosis present

## 2019-03-06 DIAGNOSIS — Z412 Encounter for routine and ritual male circumcision: Secondary | ICD-10-CM | POA: Diagnosis not present

## 2019-03-06 DIAGNOSIS — O99214 Obesity complicating childbirth: Secondary | ICD-10-CM | POA: Diagnosis not present

## 2019-03-06 DIAGNOSIS — I471 Supraventricular tachycardia: Secondary | ICD-10-CM | POA: Diagnosis not present

## 2019-03-06 DIAGNOSIS — O1002 Pre-existing essential hypertension complicating childbirth: Secondary | ICD-10-CM | POA: Diagnosis not present

## 2019-03-06 DIAGNOSIS — Z23 Encounter for immunization: Secondary | ICD-10-CM | POA: Diagnosis not present

## 2019-03-06 DIAGNOSIS — O10913 Unspecified pre-existing hypertension complicating pregnancy, third trimester: Secondary | ICD-10-CM | POA: Diagnosis present

## 2019-03-06 DIAGNOSIS — O164 Unspecified maternal hypertension, complicating childbirth: Secondary | ICD-10-CM | POA: Diagnosis not present

## 2019-03-06 LAB — TYPE AND SCREEN
ABO/RH(D): B POS
Antibody Screen: NEGATIVE

## 2019-03-06 LAB — CBC
HCT: 36.1 % (ref 36.0–46.0)
HCT: 35.8 % — ABNORMAL LOW (ref 36.0–46.0)
Hemoglobin: 11 g/dL — ABNORMAL LOW (ref 12.0–15.0)
Hemoglobin: 11 g/dL — ABNORMAL LOW (ref 12.0–15.0)
MCH: 24.1 pg — ABNORMAL LOW (ref 26.0–34.0)
MCH: 24.1 pg — ABNORMAL LOW (ref 26.0–34.0)
MCHC: 30.5 g/dL (ref 30.0–36.0)
MCHC: 30.7 g/dL (ref 30.0–36.0)
MCV: 79.2 fL — ABNORMAL LOW (ref 80.0–100.0)
MCV: 78.5 fL — ABNORMAL LOW (ref 80.0–100.0)
Platelets: 302 10*3/uL (ref 150–400)
Platelets: 255 10*3/uL (ref 150–400)
RBC: 4.56 MIL/uL (ref 3.87–5.11)
RBC: 4.56 MIL/uL (ref 3.87–5.11)
RDW: 15.2 % (ref 11.5–15.5)
RDW: 15.4 % (ref 11.5–15.5)
WBC: 13.6 10*3/uL — ABNORMAL HIGH (ref 4.0–10.5)
WBC: 12.3 10*3/uL — ABNORMAL HIGH (ref 4.0–10.5)
nRBC: 0 % (ref 0.0–0.2)
nRBC: 0 % (ref 0.0–0.2)

## 2019-03-06 LAB — ABO/RH: ABO/RH(D): B POS

## 2019-03-06 LAB — RPR: RPR Ser Ql: NONREACTIVE

## 2019-03-06 MED ORDER — OXYTOCIN 40 UNITS IN NORMAL SALINE INFUSION - SIMPLE MED: 2.5000 [IU]/h | INTRAVENOUS | Status: DC

## 2019-03-06 MED ORDER — DIPHENHYDRAMINE HCL 50 MG/ML IJ SOLN: 12.5000 mg | INTRAMUSCULAR | Status: DC | PRN

## 2019-03-06 MED ORDER — EPHEDRINE 5 MG/ML INJ: 10.0000 mg | INTRAVENOUS | Status: DC | PRN

## 2019-03-06 MED ORDER — LACTATED RINGERS IV SOLN
INTRAVENOUS | Status: DC
  Administered 2019-03-06 (×2): via INTRAVENOUS

## 2019-03-06 MED ORDER — FENTANYL CITRATE (PF) 100 MCG/2ML IJ SOLN: 50.0000 ug | INTRAMUSCULAR | Status: DC | PRN

## 2019-03-06 MED ORDER — LIDOCAINE HCL (PF) 1 % IJ SOLN: 30.0000 mL | INTRAMUSCULAR | Status: DC | PRN

## 2019-03-06 MED ORDER — ACETAMINOPHEN 325 MG PO TABS: 650.0000 mg | ORAL_TABLET | ORAL | Status: DC | PRN

## 2019-03-06 MED ORDER — FENTANYL-BUPIVACAINE-NACL 0.5-0.125-0.9 MG/250ML-% EP SOLN
12.0000 mL/h | EPIDURAL | Status: DC | PRN
  Filled 2019-03-06: qty 250

## 2019-03-06 MED ORDER — SOD CITRATE-CITRIC ACID 500-334 MG/5ML PO SOLN: 30.0000 mL | ORAL | Status: DC | PRN

## 2019-03-06 MED ORDER — SODIUM CHLORIDE 0.9 % IV SOLN
5.0000 10*6.[IU] | Freq: Once | INTRAVENOUS | Status: AC
  Administered 2019-03-06: 5 10*6.[IU] via INTRAVENOUS
  Filled 2019-03-06: qty 5

## 2019-03-06 MED ORDER — OXYCODONE-ACETAMINOPHEN 5-325 MG PO TABS: 1.0000 | ORAL_TABLET | ORAL | Status: DC | PRN

## 2019-03-06 MED ORDER — TERBUTALINE SULFATE 1 MG/ML IJ SOLN: 0.2500 mg | Freq: Once | INTRAMUSCULAR | Status: DC | PRN

## 2019-03-06 MED ORDER — SODIUM CHLORIDE (PF) 0.9 % IJ SOLN
INTRAMUSCULAR | Status: DC | PRN
  Administered 2019-03-06: 12 mL/h via EPIDURAL

## 2019-03-06 MED ORDER — LACTATED RINGERS IV SOLN
500.0000 mL | Freq: Once | INTRAVENOUS | Status: AC
  Administered 2019-03-06: 500 mL via INTRAVENOUS

## 2019-03-06 MED ORDER — MISOPROSTOL 25 MCG QUARTER TABLET: 25.0000 ug | ORAL_TABLET | ORAL | Status: DC | PRN

## 2019-03-06 MED ORDER — PHENYLEPHRINE 40 MCG/ML (10ML) SYRINGE FOR IV PUSH (FOR BLOOD PRESSURE SUPPORT): 80.0000 ug | PREFILLED_SYRINGE | INTRAVENOUS | Status: DC | PRN

## 2019-03-06 MED ORDER — LIDOCAINE HCL (PF) 1 % IJ SOLN
INTRAMUSCULAR | Status: DC | PRN
  Administered 2019-03-06: 10 mL via EPIDURAL
  Administered 2019-03-06: 2 mL via EPIDURAL

## 2019-03-06 MED ORDER — OXYCODONE-ACETAMINOPHEN 5-325 MG PO TABS: 2.0000 | ORAL_TABLET | ORAL | Status: DC | PRN

## 2019-03-06 MED ORDER — LABETALOL HCL 200 MG PO TABS
200.0000 mg | ORAL_TABLET | Freq: Two times a day (BID) | ORAL | Status: DC
  Administered 2019-03-06 (×2): 200 mg via ORAL
  Filled 2019-03-06 (×2): qty 1

## 2019-03-06 MED ORDER — PENICILLIN G POT IN DEXTROSE 60000 UNIT/ML IV SOLN
3.0000 10*6.[IU] | INTRAVENOUS | Status: DC
  Administered 2019-03-06 – 2019-03-07 (×4): 3 10*6.[IU] via INTRAVENOUS
  Filled 2019-03-06 (×4): qty 50

## 2019-03-06 MED ORDER — LACTATED RINGERS IV SOLN: 500.0000 mL | INTRAVENOUS | Status: DC | PRN

## 2019-03-06 MED ORDER — OXYTOCIN 40 UNITS IN NORMAL SALINE INFUSION - SIMPLE MED
1.0000 m[IU]/min | INTRAVENOUS | Status: DC
  Administered 2019-03-06: 09:00:00 2 m[IU]/min via INTRAVENOUS
  Filled 2019-03-06: qty 1000

## 2019-03-06 MED ORDER — OXYTOCIN BOLUS FROM INFUSION: 500.0000 mL | Freq: Once | INTRAVENOUS | Status: DC

## 2019-03-06 MED ORDER — ONDANSETRON HCL 4 MG/2ML IJ SOLN: 4.0000 mg | Freq: Four times a day (QID) | INTRAMUSCULAR | Status: DC | PRN

## 2019-03-06 NOTE — Anesthesia Procedure Notes (Signed)
Epidural Patient location during procedure: OB Start time: 03/06/2019 4:47 PM End time: 03/06/2019 5:00 PM  Staffing Anesthesiologist: Pervis Hocking, DO Performed: anesthesiologist   Preanesthetic Checklist Completed: patient identified, pre-op evaluation, timeout performed, IV checked, risks and benefits discussed and monitors and equipment checked  Epidural Patient position: sitting Prep: site prepped and draped and DuraPrep Patient monitoring: continuous pulse ox, blood pressure, heart rate and cardiac monitor Approach: midline Location: L3-L4 Injection technique: LOR air  Needle:  Needle type: Tuohy  Needle gauge: 17 G Needle length: 9 cm Needle insertion depth: 9 cm Catheter type: closed end flexible Catheter size: 19 Gauge Catheter at skin depth: 15 cm Test dose: negative  Assessment Sensory level: T8 Events: blood not aspirated, injection not painful, no injection resistance, negative IV test and no paresthesia  Additional Notes Patient identified. Risks/Benefits/Options discussed with patient including but not limited to bleeding, infection, nerve damage, paralysis, failed block, incomplete pain control, headache, blood pressure changes, nausea, vomiting, reactions to medication both or allergic, itching and postpartum back pain. Confirmed with bedside nurse the patient's most recent platelet count. Confirmed with patient that they are not currently taking any anticoagulation, have any bleeding history or any family history of bleeding disorders. Patient expressed understanding and wished to proceed. All questions were answered. Sterile technique was used throughout the entire procedure. Please see nursing notes for vital signs. Test dose was given through epidural catheter and negative prior to continuing to dose epidural or start infusion. Warning signs of high block given to the patient including shortness of breath, tingling/numbness in hands, complete motor  block, or any concerning symptoms with instructions to call for help. Patient was given instructions on fall risk and not to get out of bed. All questions and concerns addressed with instructions to call with any issues or inadequate analgesia.  Reason for block:procedure for pain

## 2019-03-06 NOTE — Progress Notes (Signed)
Ami LEILANNY FLUITT is a 28 y.o. G2P1001 at [redacted]w[redacted]d   Subjective: No complaints, comfortable s/p epidural  Objective: BP 137/80   Pulse 81   Temp 97.8 F (36.6 C) (Oral)   Resp 16   Ht 5\' 7"  (1.702 m)   Wt 133.4 kg   BMI 46.05 kg/m  No intake/output data recorded. No intake/output data recorded.  FHT:  FHR: 130 bpm, variability: moderate,  accelerations:  Present,  decelerations:  Absent UC:  q 2-3 min SVE:   Dilation: 2 Effacement (%): 60, 70 Station: -2 Exam by:: Kenzli Barritt   Labs: Lab Results  Component Value Date   WBC 12.3 (H) 03/06/2019   HGB 11.0 (L) 03/06/2019   HCT 35.8 (L) 03/06/2019   MCV 78.5 (L) 03/06/2019   PLT 255 03/06/2019    Assessment / Plan: Induction of labor due to [facility],  progressing well on pitocin  Labor: Progressing normally Preeclampsia:  no signs or symptoms of toxicity Fetal Wellbeing:  Category I Pain Control:  Epidural I/D:  GBS + no s/sxs of chorio on PCN Anticipated MOD:  NSVD  Delice Lesch 03/06/2019, 6:03 PM

## 2019-03-06 NOTE — Anesthesia Preprocedure Evaluation (Signed)
Anesthesia Evaluation  Patient identified by MRN, date of birth, ID band Patient awake    Reviewed: Allergy & Precautions, H&P , NPO status , Patient's Chart, lab work & pertinent test results  Airway Mallampati: III  TM Distance: >3 FB Neck ROM: full    Dental no notable dental hx.    Pulmonary neg pulmonary ROS,    Pulmonary exam normal        Cardiovascular hypertension, Pt. on home beta blockers and Pt. on medications negative cardio ROS Normal cardiovascular exam     Neuro/Psych negative neurological ROS  negative psych ROS   GI/Hepatic negative GI ROS, Neg liver ROS,   Endo/Other  Morbid obesityBMI 46  Renal/GU negative Renal ROS  negative genitourinary   Musculoskeletal negative musculoskeletal ROS (+)   Abdominal (+) + obese,   Peds  Hematology negative hematology ROS (+) Hct 35.8, plt 255   Anesthesia Other Findings   Reproductive/Obstetrics (+) Pregnancy                             Anesthesia Physical  Anesthesia Plan  ASA: III and emergent  Anesthesia Plan: Epidural   Post-op Pain Management:    Induction:   PONV Risk Score and Plan:   Airway Management Planned: Natural Airway  Additional Equipment: None  Intra-op Plan:   Post-operative Plan:   Informed Consent: I have reviewed the patients History and Physical, chart, labs and discussed the procedure including the risks, benefits and alternatives for the proposed anesthesia with the patient or authorized representative who has indicated his/her understanding and acceptance.       Plan Discussed with:   Anesthesia Plan Comments:         Anesthesia Quick Evaluation

## 2019-03-06 NOTE — H&P (Addendum)
Sarah Giles is a 28 y.o. female, G2P1001 at 31 weeks, presenting for induction of labor for chronic hypertension..Pt was on HCTZ prior to pregnancy but switched to labetalol 200 mg PO BID and 81 mg ASA.  Pt pregnancy was complicated by the chronic hypertension, GBS positive in urine and Vitamin D deficiency and has been taking D3 during pregnancy. Patient had complaints of SOB and was evaluated by cardiology with echo and holter monitor completed. Pt was noted to have diastolic dysfunction with Holter monitor showing some short periods of SVT. Pt was encouraged to make life style changes and labetalol was adjusted to 400mg  BID.  Pt is to have further testing during the postpartum period. Cardiology and MFM have participated in this patients care. Patient Active Problem List   Diagnosis Date Noted  . Chronic hypertension with exacerbation during pregnancy in third trimester 03/06/2019  . Palpitations 11/08/2018  . Dizziness 11/08/2018  . HTN (hypertension) 11/08/2018  . NSVD (normal spontaneous vaginal delivery) 04/28/2014  . Morbid obesity (HCC) 04/27/2014  . Gestational hypertension 04/27/2014    History of present pregnancy: Patient entered care at 6 weeks.   EDC of 03/20/2019 was established by US/LMP.   Anatomy scan:  20 weeks, with normal findings and an anterior placenta.   Additional 03/22/2019 evaluations: montly growth scans and twice weekly testing started at 32 weeks [place] at 35 weeks EFW 91% with AFI wnl Significant prenatal events: CHTN, SVT Last evaluation:  Last week  OB History    Gravida  2   Para  1   Term  1   Preterm      AB      Living  1     SAB      TAB      Ectopic      Multiple  0   Live Births  1          Past Medical History:  Diagnosis Date  . Allergy   . Gestational hypertension 04/27/2014  . Hypertension   . Medical history non-contributory   . Morbid obesity (HCC) 04/27/2014  . NSVD (normal spontaneous vaginal delivery) 04/28/2014   Past  Surgical History:  Procedure Laterality Date  . BREAST SURGERY     2008   Family History: family history includes Diabetes in her father and paternal grandfather; Hypertension in her maternal grandmother and mother. Social History:  reports that she has never smoked. She has never used smokeless tobacco. She reports previous alcohol use of about 3.0 standard drinks of alcohol per week. She reports that she does not use drugs.   Prenatal Transfer Tool  Maternal Diabetes: No Genetic Screening: Normal Maternal Ultrasounds/Referrals: Normal Fetal Ultrasounds or other Referrals:  None Maternal Substance Abuse:  No Significant Maternal Medications:  Meds include: Other: Labetalol, ASA Significant Maternal Lab Results: Group B Strep positive  TDAP Declined Flu Declined  ROS: All 10 Systems reviewed in office and negative  No Known Allergies  PE Lungs cta cv rrr abd gravid NT Ext no calf tenderness   FHR: Category 1 UCs: none  Prenatal labs: ABO, Rh: B/Positive/-- (03/31 0000) Antibody: Negative (03/31 0000) Rubella:  Immune (03/31 0000) RPR: Nonreactive (03/31 0000)  HBsAg: Negative (03/31 0000)  HIV: Non-reactive (03/31 0000)  GBS: Positive/-- (03/31 0000) Sickle cell/Hgb electrophoresis:  AF Pap:  2019 Neg GC: Neg Chlamydia:  Neg Genetic screenings:  Glucola:  132 Other:   Hgb 11.9 at NOB, 11.2 at 28 weeks  Assessment/Plan: Pt is a 28 yo  G2P1001 at IUP 38 weeks with CHTN who presents for induction of labor Morbid obesity GBS positive  Plan: Admit to [facility]  Routine [facility] orders Pain med/epidural prn PCN G for GBS prophylaxis   Pleas Koch ProtheroCNM, MSN 03/06/2019, 5:18 AM   1315 pt seen and examined.  AROM performed clear fluid.  Exam unchanged.  Cat 1 tracing.  ctxs regular but mild.  Plan referral back to cardiology PP.

## 2019-03-06 NOTE — Progress Notes (Signed)
Subjective:    Doing well, comfortable w/ epidural.   Objective:    VS: BP (!) 113/59   Pulse 72   Temp 97.8 F (36.6 C) (Axillary)   Resp 17   Ht 5\' 7"  (1.702 m)   Wt 133.4 kg   BMI 46.05 kg/m  FHR : baseline 130 / variability moderate / accelerations present / absnet decelerations IUPC: contractions every 2-4 minutes / MVU 220, adequate Membranes: AROM @ 1254 on 03/06/19, afebrile Dilation: 5 Effacement (%): 90 Cervical Position: Posterior Station: -1 Presentation: Vertex Exam by:: Burman Foster, CNM  Pitocin: 18 mU/min  Assessment/Plan:   28 y.o. G2P1001 [redacted]w[redacted]d IOL for CHTN  Labor: Progressing normally on Pitocin Preeclampsia:  no signs or symptoms of toxicity Fetal Wellbeing:  Category I Pain Control:  Epidural I/D:  GBS positive, adequate treatment w/ PCN Anticipated MOD:  NSVD  Ronney Lion, MSN 03/06/2019 11:50 PM

## 2019-03-07 ENCOUNTER — Encounter (HOSPITAL_COMMUNITY): Payer: Self-pay

## 2019-03-07 LAB — CBC
HCT: 37.7 % (ref 36.0–46.0)
Hemoglobin: 11.9 g/dL — ABNORMAL LOW (ref 12.0–15.0)
MCH: 24.5 pg — ABNORMAL LOW (ref 26.0–34.0)
MCHC: 31.6 g/dL (ref 30.0–36.0)
MCV: 77.6 fL — ABNORMAL LOW (ref 80.0–100.0)
Platelets: 289 10*3/uL (ref 150–400)
RBC: 4.86 MIL/uL (ref 3.87–5.11)
RDW: 15.2 % (ref 11.5–15.5)
WBC: 18.6 10*3/uL — ABNORMAL HIGH (ref 4.0–10.5)
nRBC: 0 % (ref 0.0–0.2)

## 2019-03-07 MED ORDER — ONDANSETRON HCL 4 MG/2ML IJ SOLN: 4.0000 mg | INTRAMUSCULAR | Status: DC | PRN

## 2019-03-07 MED ORDER — TETANUS-DIPHTH-ACELL PERTUSSIS 5-2.5-18.5 LF-MCG/0.5 IM SUSP: 0.5000 mL | Freq: Once | INTRAMUSCULAR | Status: DC

## 2019-03-07 MED ORDER — ENOXAPARIN SODIUM 30 MG/0.3ML ~~LOC~~ SOLN: 30.0000 mg | SUBCUTANEOUS | Status: DC

## 2019-03-07 MED ORDER — PRENATAL MULTIVITAMIN CH
1.0000 | ORAL_TABLET | Freq: Every day | ORAL | Status: DC
  Administered 2019-03-07 – 2019-03-08 (×2): 1 via ORAL
  Filled 2019-03-07 (×2): qty 1

## 2019-03-07 MED ORDER — ACETAMINOPHEN 325 MG PO TABS: 650.0000 mg | ORAL_TABLET | ORAL | Status: DC | PRN

## 2019-03-07 MED ORDER — IBUPROFEN 600 MG PO TABS
600.0000 mg | ORAL_TABLET | Freq: Four times a day (QID) | ORAL | Status: DC
  Administered 2019-03-07 – 2019-03-08 (×4): 600 mg via ORAL
  Filled 2019-03-07 (×5): qty 1

## 2019-03-07 MED ORDER — LABETALOL HCL 200 MG PO TABS
200.0000 mg | ORAL_TABLET | Freq: Two times a day (BID) | ORAL | Status: DC
  Administered 2019-03-07 – 2019-03-08 (×3): 200 mg via ORAL
  Filled 2019-03-07 (×3): qty 1

## 2019-03-07 MED ORDER — ONDANSETRON HCL 4 MG PO TABS: 4.0000 mg | ORAL_TABLET | ORAL | Status: DC | PRN

## 2019-03-07 MED ORDER — DIBUCAINE (PERIANAL) 1 % EX OINT: 1.0000 "application " | TOPICAL_OINTMENT | CUTANEOUS | Status: DC | PRN

## 2019-03-07 MED ORDER — BENZOCAINE-MENTHOL 20-0.5 % EX AERO: 1.0000 "application " | INHALATION_SPRAY | CUTANEOUS | Status: DC | PRN

## 2019-03-07 MED ORDER — ENOXAPARIN SODIUM 80 MG/0.8ML ~~LOC~~ SOLN
0.5000 mg/kg | SUBCUTANEOUS | Status: DC
  Administered 2019-03-08: 65 mg via SUBCUTANEOUS
  Filled 2019-03-07: qty 0.8

## 2019-03-07 MED ORDER — WITCH HAZEL-GLYCERIN EX PADS: 1.0000 "application " | MEDICATED_PAD | CUTANEOUS | Status: DC | PRN

## 2019-03-07 MED ORDER — SIMETHICONE 80 MG PO CHEW: 80.0000 mg | CHEWABLE_TABLET | ORAL | Status: DC | PRN

## 2019-03-07 MED ORDER — COCONUT OIL OIL: 1.0000 "application " | TOPICAL_OIL | Status: DC | PRN

## 2019-03-07 MED ORDER — SENNOSIDES-DOCUSATE SODIUM 8.6-50 MG PO TABS
2.0000 | ORAL_TABLET | ORAL | Status: DC
  Administered 2019-03-07: 2 via ORAL
  Filled 2019-03-07: qty 2

## 2019-03-07 MED ORDER — DIPHENHYDRAMINE HCL 25 MG PO CAPS: 25.0000 mg | ORAL_CAPSULE | Freq: Four times a day (QID) | ORAL | Status: DC | PRN

## 2019-03-07 NOTE — Progress Notes (Signed)
VTE Prophylaxis added to include Lovenox for BMI greater than 40. Dosing discussed w/ Pharmacist: 0.5 mg/kg and will begin 24 hours after epidural anesthesia was discontinued.    Burman Foster, MSN, CNM 03/07/2019 7:22 AM

## 2019-03-07 NOTE — Anesthesia Postprocedure Evaluation (Signed)
Anesthesia Post Note  Patient: Sarah Giles  Procedure(s) Performed: AN AD HOC LABOR EPIDURAL     Patient location during evaluation: Mother Baby Anesthesia Type: Epidural Level of consciousness: awake and alert and oriented Pain management: satisfactory to patient Vital Signs Assessment: post-procedure vital signs reviewed and stable Respiratory status: spontaneous breathing and nonlabored ventilation Cardiovascular status: stable Postop Assessment: no headache, no backache, no signs of nausea or vomiting, adequate PO intake, patient able to bend at knees and able to ambulate (patient up walking) Anesthetic complications: no    Last Vitals:  Vitals:   03/07/19 0929 03/07/19 1300  BP: 120/72 108/63  Pulse: 90 81  Resp: 18 18  Temp:  37.1 C  SpO2: 100% 100%    Last Pain:  Vitals:   03/07/19 1440  TempSrc:   PainSc: 0-No pain   Pain Goal:                   [name]

## 2019-03-07 NOTE — Progress Notes (Addendum)
Per sign-out w/ Dr. Mancel Bale: Pt was evaluated by cardiology for SOB this pregnancy and completed an echo and holter monitor test. Pt does not need and MRI postpartum. Pt will follow-up with cardiologist via telehealth in Jan 2021.   Burman Foster, MSN, CNM 03/07/2019 6:59 AM

## 2019-03-07 NOTE — Lactation Note (Addendum)
This note was copied from a baby's chart. Lactation Consultation Note  Patient Name: Sarah Giles QBHAL'P Date: 03/07/2019  Mom is a g2p2 with poor breastfeeding history.Baby Sarah Schnieders now 54 hours old. Mom reports breastfed and formula fed with her first baby about 3 weeks.Mom reports it was really hard. Mom reports she never felt her milk come in with her first. Mom obese with hx of anxiety and breast reduction.  Mom reports her breast feeling has really increased with this pregnancy.  Mom reports she has had nipple burning sensations with this pregnancy.  Urged mom to hand express past each breastfeeding and feed back all expressed breastmilk via spoon. Discussed post pumping with DEBP  Past bf  During day and everytime mom gives formula bottle.  Urged to feed on cue and 8 or more times day.Mom reprots she does not have a DEBP for home use.  Reviewed [facility] and [facility] resource list.  Praised breastfeeding efforts.  Urged to call lactation as needed.     Maternal Data    Feeding Feeding Type: Bottle Fed - Formula Nipple Type: Slow - flow  LATCH Score Latch: Repeated attempts needed to sustain latch, nipple held in mouth throughout feeding, stimulation needed to elicit sucking reflex.  Audible Swallowing: A few with stimulation  Type of Nipple: Everted at rest and after stimulation  Comfort (Breast/Nipple): Soft / non-tender  Hold (Positioning): Assistance needed to correctly position infant at breast and maintain latch.  LATCH Score: 7  Interventions    Lactation Tools Discussed/Used     Consult Status      Rae Sutcliffe Thompson Caul 03/07/2019, 3:19 PM

## 2019-03-08 LAB — CBC
HCT: 36.3 % (ref 36.0–46.0)
Hemoglobin: 11.1 g/dL — ABNORMAL LOW (ref 12.0–15.0)
MCH: 24.1 pg — ABNORMAL LOW (ref 26.0–34.0)
MCHC: 30.6 g/dL (ref 30.0–36.0)
MCV: 78.7 fL — ABNORMAL LOW (ref 80.0–100.0)
Platelets: 277 10*3/uL (ref 150–400)
RBC: 4.61 MIL/uL (ref 3.87–5.11)
RDW: 15.5 % (ref 11.5–15.5)
WBC: 13.8 10*3/uL — ABNORMAL HIGH (ref 4.0–10.5)
nRBC: 0 % (ref 0.0–0.2)

## 2019-03-08 MED ORDER — IBUPROFEN 600 MG PO TABS: 600.0000 mg | ORAL_TABLET | Freq: Four times a day (QID) | ORAL | 0 refills | Status: DC

## 2019-03-08 MED ORDER — LABETALOL HCL 200 MG PO TABS: 200.0000 mg | ORAL_TABLET | Freq: Two times a day (BID) | ORAL | 1 refills | Status: DC

## 2019-03-08 NOTE — Discharge Summary (Addendum)
OB Discharge Summary     Patient Name: Sarah Giles DOB: [birth year] MRN: [id number]  Date of admission: 03/06/2019 Delivering Provider: Arrie Eastern CNM  Date of discharge: 03/08/2019  Admitting diagnosis: PREG Intrauterine pregnancy: [redacted]w[redacted]d     Secondary diagnosis:  Active Problems:   SVD (11/10)   Chronic hypertension with exacerbation during pregnancy in third trimester  Additional problems:  Patient Active Problem List   Diagnosis Date Noted  . SVD (11/10) 03/07/2019    Priority: Medium  . Chronic hypertension with exacerbation during pregnancy in third trimester 03/06/2019  . Palpitations 11/08/2018  . Dizziness 11/08/2018  . HTN (hypertension) 11/08/2018  . NSVD (normal spontaneous vaginal delivery) 04/28/2014  . Morbid obesity ([place]) 04/27/2014        Discharge diagnosis: Term Pregnancy Delivered and Gestational Hypertension                                                                                                Post partum procedures:None  Augmentation: AROM and Pitocin  Complications: None  Hospital course:  Induction of Labor With Vaginal Delivery   28 y.o. yo G2P2002 at [redacted]w[redacted]d was admitted to the hospital 03/06/2019 for induction of labor.  Indication for induction: Gestational hypertension.  Patient had an uncomplicated labor course as follows: Membrane Rupture Time/Date: 12:54 PM ,03/06/2019   Intrapartum Procedures: Episiotomy: None [1]                                         Lacerations:  None [1]  Patient had delivery of a Viable female infant.  Information for the patient's newborn:  Kayela, Humphres [[id number]]  Delivery Method: Vaginal, Spontaneous(Filed from Delivery Summary)    03/07/2019  Details of delivery can be found in separate delivery note.  Patient had a routine postpartum course. Patient is discharged home 03/08/19.  Physical exam  Vitals:   03/07/19 0929 03/07/19 1300 03/07/19 1723 03/07/19 2115  BP: 120/72 108/63 129/87 (!)  127/96  Pulse: 90 81 88 74  Resp: 18 18 16 18   Temp:  98.8 F (37.1 C)  97.8 F (36.6 C)  TempSrc:  Oral  Oral  SpO2: 100% 100% 100% 100%  Weight:      Height:       General: alert, cooperative and no distress Lochia: appropriate Uterine Fundus: firm Perineum: intact, no hemorrhoids DVT Evaluation: No evidence of DVT seen on physical exam. No cords or calf tenderness. No significant calf/ankle edema. Labs: Lab Results  Component Value Date   WBC 18.6 (H) 03/07/2019   HGB 11.9 (L) 03/07/2019   HCT 37.7 03/07/2019   MCV 77.6 (L) 03/07/2019   PLT 289 03/07/2019   CMP Latest Ref Rng & Units 09/14/2018  Glucose 70 - 99 mg/dL 86  BUN 6 - 20 mg/dL 8  Creatinine 0.44 - 1.00 mg/dL 0.70  Sodium 135 - 145 mmol/L 138  Potassium 3.5 - 5.1 mmol/L 3.5  Chloride 98 - 111 mmol/L 105  CO2 22 -  32 mmol/L 20(L)  Calcium 8.9 - 10.3 mg/dL 9.4  Total Protein 6.5 - 8.1 g/dL 7.1  Total Bilirubin 0.3 - 1.2 mg/dL 0.3  Alkaline Phos 38 - 126 U/L 48  AST 15 - 41 U/L 13(L)  ALT 0 - 44 U/L 13    Discharge instruction: per After Visit Summary and "Baby and Me Booklet".  After visit meds:  Allergies as of 03/08/2019   No Known Allergies     Medication List    STOP taking these medications   aspirin EC 81 MG tablet   diphenhydramine-acetaminophen 25-500 MG Tabs tablet Commonly known as: TYLENOL PM     TAKE these medications   ibuprofen 600 MG tablet Commonly known as: ADVIL Take 1 tablet (600 mg total) by mouth every 6 (six) hours.   labetalol 200 MG tablet Commonly known as: NORMODYNE Take 1 tablet (200 mg total) by mouth 2 (two) times daily. What changed: how much to take   Omega 3 500 500 MG Caps Take 2,000 mg by mouth at bedtime. Unsure of dosage   prenatal multivitamin Tabs tablet Take 1 tablet by mouth daily at 12 noon.       Diet: low salt diet  Activity: Advance as tolerated. Pelvic rest for 6 weeks.   Outpatient follow up: Return to [facility] in 1  week for BP check and then in 6 weeks for regular postpartum visit. Follow up Appt:No future appointments. Follow up Visit:No follow-ups on file.  Postpartum contraception: Progesterone only pills at 6 week visit  Newborn Data: Live born female  Birth Weight: 7 lb 12.7 oz (3535 g) APGAR: 9, 9  Newborn Delivery   Birth date/time: 03/07/2019 04:58:00 Delivery type: Vaginal, Spontaneous      Baby Feeding: Bottle and Breast Disposition:home with mother   03/08/2019 Roma Schanz, CNM  Attestation of Attending Supervision of Advanced Practitioner (CNM/NP): Evaluation and management procedures were performed by the Advanced Practitioner under my supervision and collaboration.  I have reviewed the Advanced Practitioner's note and chart, and I agree with the management and plan.  I saw and examined patient at bedside and agree with above findings, assessment and plan as outlined above by CNM Rhea Pink.   -I discussed with patient risks, benefits and alternatives of circumcision including risks of bleeding, infection, damage to organs and need for further procedures.  All her questions were answered and she consented for the procedure to be done on her son.  Dr. Sallye Ober.  03/08/2019.

## 2019-03-08 NOTE — Progress Notes (Signed)
CSW received consult for hx of Anxiety. CSW met with MOB to offer support and complete assessment.    Upon entering the room, CSW congratulated MOB and FOB on the birth of infant. CSW advised MOB and FOB of the HIPPA policy and MOB requested that FOB remain in the room. CSW understanding and proceeded with assessing MOB. CSW advised MOB of CSW's role and the reason for CSW coming to visit with her. MOB reports that she was never formally diagnosed with anxiety, however reports that she did advised her OB provider of the symptoms that she was having. MOB reported that back in May she began to have health scares and started to over obsess with the idea of dying. MOB report that should would ask herself things that would increase her anxiety at times. 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 MOB reported that she did deal with anxiety from May  Until November. MOB reports that she was working from home, taking care of her 29 year old, purchasing a home, and other life changes. MOB reports that since she has given birth however she has been feeling great. MOB denies SI and HI. MOB expressed no previous medications for anxiety but does report that she is in therapy regularly for her anxiety.   MOB reports that she has all needed items to care for infant and plans for infant to be seen at [facility] for follow up care.   CSW provided education regarding the baby blues period vs. perinatal mood disorders, discussed treatment and gave resources for mental health follow up if concerns arise.  CSW recommends self-evaluation during the postpartum time period using the New Mom Checklist from Postpartum Progress and encouraged MOB to contact a medical professional if symptoms are noted at any time.   CSW provided review of Sudden Infant Death Syndrome (SIDS) precautions.   CSW identifies no further need for  intervention and no barriers to discharge at this time.    Sarah Giles, MSW, LCSW [facility] [place] [phone]

## 2019-03-12 ENCOUNTER — Encounter (HOSPITAL_COMMUNITY): Payer: Self-pay

## 2019-03-12 ENCOUNTER — Inpatient Hospital Stay (HOSPITAL_COMMUNITY): Payer: BC Managed Care – PPO

## 2019-03-12 ENCOUNTER — Inpatient Hospital Stay (HOSPITAL_COMMUNITY)
Admission: AD | Admit: 2019-03-12 | Discharge: 2019-03-12 | Disposition: A | Discharge status: Home / Self Care | Payer: BC Managed Care – PPO | Attending: Obstetrics & Gynecology | Admitting: Obstetrics & Gynecology

## 2019-03-12 ENCOUNTER — Other Ambulatory Visit: Payer: Self-pay

## 2019-03-12 DIAGNOSIS — F419 Anxiety disorder, unspecified: Secondary | ICD-10-CM | POA: Diagnosis not present

## 2019-03-12 DIAGNOSIS — Z833 Family history of diabetes mellitus: Secondary | ICD-10-CM | POA: Insufficient documentation

## 2019-03-12 DIAGNOSIS — R0602 Shortness of breath: Secondary | ICD-10-CM | POA: Diagnosis not present

## 2019-03-12 DIAGNOSIS — O99345 Other mental disorders complicating the puerperium: Secondary | ICD-10-CM | POA: Insufficient documentation

## 2019-03-12 DIAGNOSIS — O99215 Obesity complicating the puerperium: Secondary | ICD-10-CM | POA: Diagnosis not present

## 2019-03-12 DIAGNOSIS — O165 Unspecified maternal hypertension, complicating the puerperium: Secondary | ICD-10-CM

## 2019-03-12 DIAGNOSIS — R42 Dizziness and giddiness: Secondary | ICD-10-CM | POA: Insufficient documentation

## 2019-03-12 DIAGNOSIS — Z791 Long term (current) use of non-steroidal anti-inflammatories (NSAID): Secondary | ICD-10-CM | POA: Diagnosis not present

## 2019-03-12 DIAGNOSIS — O909 Complication of the puerperium, unspecified: Secondary | ICD-10-CM | POA: Insufficient documentation

## 2019-03-12 DIAGNOSIS — Z8249 Family history of ischemic heart disease and other diseases of the circulatory system: Secondary | ICD-10-CM | POA: Diagnosis not present

## 2019-03-12 LAB — COMPREHENSIVE METABOLIC PANEL
ALT: 52 U/L — ABNORMAL HIGH (ref 0–44)
AST: 32 U/L (ref 15–41)
Albumin: 3 g/dL — ABNORMAL LOW (ref 3.5–5.0)
Alkaline Phosphatase: 66 U/L (ref 38–126)
Anion gap: 10 (ref 5–15)
BUN: 8 mg/dL (ref 6–20)
CO2: 21 mmol/L — ABNORMAL LOW (ref 22–32)
Calcium: 8.9 mg/dL (ref 8.9–10.3)
Chloride: 108 mmol/L (ref 98–111)
Creatinine, Ser: 0.85 mg/dL (ref 0.44–1.00)
GFR calc Af Amer: >60 mL/min (ref >60)
GFR calc non Af Amer: >60 mL/min (ref >60)
Glucose, Bld: 78 mg/dL (ref 70–99)
Potassium: 3.5 mmol/L (ref 3.5–5.1)
Sodium: 139 mmol/L (ref 135–145)
Total Bilirubin: <0.1 mg/dL — ABNORMAL LOW (ref 0.3–1.2)
Total Protein: 6.5 g/dL (ref 6.5–8.1)

## 2019-03-12 LAB — CBC
HCT: 36.2 % (ref 36.0–46.0)
Hemoglobin: 11.2 g/dL — ABNORMAL LOW (ref 12.0–15.0)
MCH: 24.1 pg — ABNORMAL LOW (ref 26.0–34.0)
MCHC: 30.9 g/dL (ref 30.0–36.0)
MCV: 77.8 fL — ABNORMAL LOW (ref 80.0–100.0)
Platelets: 333 10*3/uL (ref 150–400)
RBC: 4.65 MIL/uL (ref 3.87–5.11)
RDW: 15.4 % (ref 11.5–15.5)
WBC: 9.7 10*3/uL (ref 4.0–10.5)
nRBC: 0 % (ref 0.0–0.2)

## 2019-03-12 LAB — PROTEIN / CREATININE RATIO, URINE
Creatinine, Urine: 91.95 mg/dL
Protein Creatinine Ratio: 0.14 mg/mg{Cre} (ref 0.00–0.15)
Total Protein, Urine: 13 mg/dL

## 2019-03-12 MED ORDER — IOHEXOL 350 MG/ML SOLN
100.0000 mL | Freq: Once | INTRAVENOUS | Status: AC | PRN
  Administered 2019-03-12: 65 mL via INTRAVENOUS

## 2019-03-12 NOTE — MAU Note (Signed)
Patient back from CT. RN made aware of patient reaction to contrast. Patient states contrast made her itchy and she became nauseous and vomited. Patient denies needing Benadryl and states symptoms are getting better. Patient will let RN know if she would like Benadryl.

## 2019-03-12 NOTE — MAU Provider Note (Addendum)
History     CSN: [id number]  Arrival date and time: 03/12/19 3154   First Provider Initiated Contact with Patient 03/12/19 0408      Chief Complaint  Patient presents with  . Hypertension  . Dizziness   HPI   Sarah Giles is a 28 y.o. female [phone] with a history of chronic hypertension status post vaginal delivery on 11/11 here with elevated BP and dizziness.  Dizziness and labored breathing since today. Feels like this is anxiety however wants to make sure everything is ok.  She has pressure in different parts of her head, however no headache. The highest her BP was at home since delivery was 159/101.  She is currently taking labetalol 200 mg BID. Her last dose was at 10 pm. No scotoma. Bleeding is minimal.   OB History    Gravida  2   Para  2   Term  2   Preterm      AB      Living  2     SAB      TAB      Ectopic      Multiple  0   Live Births  2           Past Medical History:  Diagnosis Date  . Allergy   . Gestational hypertension 04/27/2014  . Hypertension   . Medical history non-contributory   . Morbid obesity ([place]) 04/27/2014  . NSVD (normal spontaneous vaginal delivery) 04/28/2014    Past Surgical History:  Procedure Laterality Date  . BREAST SURGERY     2008    Family History  Problem Relation Age of Onset  . Hypertension Mother   . Diabetes Father   . Hypertension Maternal Grandmother   . Diabetes Paternal Grandfather     Social History   Tobacco Use  . Smoking status: Never Smoker  . Smokeless tobacco: Never Used  Substance Use Topics  . Alcohol use: Not Currently    Alcohol/week: 3.0 standard drinks    Types: 3 Standard drinks or equivalent per week  . Drug use: Never    Allergies: No Known Allergies  Medications Prior to Admission  Medication Sig Dispense Refill Last Dose  . labetalol (NORMODYNE) 200 MG tablet Take 1 tablet (200 mg total) by mouth 2 (two) times daily. 60 tablet 1 03/11/2019 at Unknown time  .  Omega-3 Fatty Acids (OMEGA 3 500) 500 MG CAPS Take 2,000 mg by mouth at bedtime. Unsure of dosage    03/11/2019 at Unknown time  . Prenatal Vit-Fe Fumarate-FA (PRENATAL MULTIVITAMIN) TABS tablet Take 1 tablet by mouth daily at 12 noon.   03/11/2019 at Unknown time  . ibuprofen (ADVIL) 600 MG tablet Take 1 tablet (600 mg total) by mouth every 6 (six) hours. 30 tablet 0    Results for orders placed or performed during the hospital encounter of 03/12/19 (from the past 48 hour(s))  Protein / creatinine ratio, urine     Status: None   Collection Time: 03/12/19  4:06 AM  Result Value Ref Range   Creatinine, Urine 91.95 mg/dL   Total Protein, Urine 13 mg/dL    Comment: NO NORMAL RANGE ESTABLISHED FOR THIS TEST   Protein Creatinine Ratio 0.14 0.00 - 0.15 mg/mg[Cre]    Comment: Performed at [facility] [address] [place], [place] [address]  CBC     Status: Abnormal   Collection Time: 03/12/19  4:29 AM  Result Value Ref Range  WBC 9.7 4.0 - 10.5 K/uL   RBC 4.65 3.87 - 5.11 MIL/uL   Hemoglobin 11.2 (L) 12.0 - 15.0 g/dL   HCT 16.1 09.6 - 04.5 %   MCV 77.8 (L) 80.0 - 100.0 fL   MCH 24.1 (L) 26.0 - 34.0 pg   MCHC 30.9 30.0 - 36.0 g/dL   RDW 40.9 81.1 - 91.4 %   Platelets 333 150 - 400 K/uL   nRBC 0.0 0.0 - 0.2 %    Comment: Performed at [facility], [address], [place], [place] [address]  Comprehensive metabolic panel     Status: Abnormal   Collection Time: 03/12/19  4:29 AM  Result Value Ref Range   Sodium 139 135 - 145 mmol/L   Potassium 3.5 3.5 - 5.1 mmol/L   Chloride 108 98 - 111 mmol/L   CO2 21 (L) 22 - 32 mmol/L   Glucose, Bld 78 70 - 99 mg/dL   BUN 8 6 - 20 mg/dL   Creatinine, Ser 6.21 0.44 - 1.00 mg/dL   Calcium 8.9 8.9 - 30.8 mg/dL   Total Protein 6.5 6.5 - 8.1 g/dL   Albumin 3.0 (L) 3.5 - 5.0 g/dL   AST 32 15 - 41 U/L   ALT 52 (H) 0 - 44 U/L   Alkaline Phosphatase 66 38 - 126 U/L   Total Bilirubin <0.1 (L) 0.3 - 1.2 mg/dL   GFR calc non Af Amer >60 >60  mL/min   GFR calc Af Amer >60 >60 mL/min   Anion gap 10 5 - 15    Comment: Performed at [facility], [address] [place], [place] [address]   Ct Angio Chest Pe W Or Wo Contrast  Result Date: 03/12/2019 CLINICAL DATA:  Postpartum. Shortness of breath. Lightheadedness. EXAM: CT ANGIOGRAPHY CHEST WITH CONTRAST TECHNIQUE: Multidetector CT imaging of the chest was performed using the standard protocol during bolus administration of intravenous contrast. Multiplanar CT image reconstructions and MIPs were obtained to evaluate the vascular anatomy. Following the IV administration of contrast patient reported it itchiness and had 1 episode of emesis. CONTRAST:  65mL OMNIPAQUE IOHEXOL 350 MG/ML SOLN COMPARISON:  None. FINDINGS: Cardiovascular: Diminished exam detail due to suboptimal pulmonary arterial opacification. The main pulmonary artery is patent. No acute pulmonary emboli identified. No central obstructing pulmonary embolus identified. No lobar pulmonary artery filling defects identified. Beyond the level of the lobar pulmonary arteries there is suboptimal opacification of the segmental and subsegmental pulmonary arteries. Mediastinum/Nodes: No enlarged mediastinal, hilar, or axillary lymph nodes. Thyroid gland, trachea, and esophagus demonstrate no significant findings. Small hiatal hernia noted. Lungs/Pleura: Lungs are clear. No pleural effusion or pneumothorax. Upper Abdomen: No acute abnormality. Musculoskeletal: No chest wall abnormality. No acute or significant osseous findings. Review of the MIP images confirms the above findings. IMPRESSION: 1. Diminished exam detail due to suboptimal pulmonary arterial opacification. Itching and 1 episode of emesis noted following the IV administration of contrast material. For this reason, a second attempt to achieve more optimal pulmonary arterial opacification was not performed. 2. No central obstructing pulmonary embolus identified. 3. No active  cardiopulmonary abnormalities identified. Electronically Signed   By: Signa Kell M.D.   On: 03/12/2019 06:54   Review of Systems  Eyes: Negative for photophobia (Floaters occasionally ) and visual disturbance.  Neurological: Negative for headaches.   Physical Exam   Blood pressure (!) 137/93, pulse 84, temperature 98.1 F (36.7 C), temperature source Oral, resp. rate 18, SpO2 100 %,  unknown if currently breastfeeding.   Patient Vitals for the past 24 hrs:  BP Temp Temp src Pulse Resp SpO2  03/12/19 0700 132/89 - - 94 - -  03/12/19 0450 130/83 - - 87 - -  03/12/19 0445 126/82 - - 87 - 100 %  03/12/19 0443 126/84 - - 79 - -  03/12/19 0440 118/66 - - 71 - -  03/12/19 0435 139/85 - - 77 - -  03/12/19 0425 136/79 - - 80 - -  03/12/19 0400 (!) 137/93 - - 84 - -  03/12/19 0350 140/86 98.1 F (36.7 C) Oral 95 18 100 %    Physical Exam  Constitutional: She is oriented to person, place, and time. She appears well-developed and well-nourished. No distress.  HENT:  Head: Normocephalic.  Musculoskeletal: Normal range of motion.        General: No edema.  Neurological: She is alert and oriented to person, place, and time.  Skin: Skin is warm. She is not diaphoretic.  Psychiatric: Her behavior is normal.   MAU Course  Procedures  None  MDM  CBC, CMP, Protein creatine ratio  Pulse ox 100 % on RA CT scan ordered d/t postpartum status and occasional shortness of breath.  Report given to Sharen Counter who resumes care of the patient.   Rasch, Harolyn Rutherford, NP  Assessment and Plan    Imaging: Ct Angio Chest Pe W Or Wo Contrast  Result Date: 03/12/2019 CLINICAL DATA:  Postpartum. Shortness of breath. Lightheadedness. EXAM: CT ANGIOGRAPHY CHEST WITH CONTRAST TECHNIQUE: Multidetector CT imaging of the chest was performed using the standard protocol during bolus administration of intravenous contrast. Multiplanar CT image reconstructions and MIPs were obtained to evaluate the  vascular anatomy. Following the IV administration of contrast patient reported it itchiness and had 1 episode of emesis. CONTRAST:  7mL OMNIPAQUE IOHEXOL 350 MG/ML SOLN COMPARISON:  None. FINDINGS: Cardiovascular: Diminished exam detail due to suboptimal pulmonary arterial opacification. The main pulmonary artery is patent. No acute pulmonary emboli identified. No central obstructing pulmonary embolus identified. No lobar pulmonary artery filling defects identified. Beyond the level of the lobar pulmonary arteries there is suboptimal opacification of the segmental and subsegmental pulmonary arteries. Mediastinum/Nodes: No enlarged mediastinal, hilar, or axillary lymph nodes. Thyroid gland, trachea, and esophagus demonstrate no significant findings. Small hiatal hernia noted. Lungs/Pleura: Lungs are clear. No pleural effusion or pneumothorax. Upper Abdomen: No acute abnormality. Musculoskeletal: No chest wall abnormality. No acute or significant osseous findings. Review of the MIP images confirms the above findings. IMPRESSION: 1. Diminished exam detail due to suboptimal pulmonary arterial opacification. Itching and 1 episode of emesis noted following the IV administration of contrast material. For this reason, a second attempt to achieve more optimal pulmonary arterial opacification was not performed. 2. No central obstructing pulmonary embolus identified. 3. No active cardiopulmonary abnormalities identified. Electronically Signed   By: Signa Kell M.D.   On: 03/12/2019 06:54   Labs: Results for orders placed or performed during the hospital encounter of 03/12/19 (from the past 24 hour(s))  Protein / creatinine ratio, urine     Status: None   Collection Time: 03/12/19  4:06 AM  Result Value Ref Range   Creatinine, Urine 91.95 mg/dL   Total Protein, Urine 13 mg/dL   Protein Creatinine Ratio 0.14 0.00 - 0.15 mg/mg[Cre]  CBC     Status: Abnormal   Collection Time: 03/12/19  4:29 AM  Result Value Ref  Range   WBC 9.7 4.0 - 10.5 K/uL  RBC 4.65 3.87 - 5.11 MIL/uL   Hemoglobin 11.2 (L) 12.0 - 15.0 g/dL   HCT 16.136.2 09.636.0 - 04.546.0 %   MCV 77.8 (L) 80.0 - 100.0 fL   MCH 24.1 (L) 26.0 - 34.0 pg   MCHC 30.9 30.0 - 36.0 g/dL   RDW 40.915.4 81.111.5 - 91.415.5 %   Platelets 333 150 - 400 K/uL   nRBC 0.0 0.0 - 0.2 %  Comprehensive metabolic panel     Status: Abnormal   Collection Time: 03/12/19  4:29 AM  Result Value Ref Range   Sodium 139 135 - 145 mmol/L   Potassium 3.5 3.5 - 5.1 mmol/L   Chloride 108 98 - 111 mmol/L   CO2 21 (L) 22 - 32 mmol/L   Glucose, Bld 78 70 - 99 mg/dL   BUN 8 6 - 20 mg/dL   Creatinine, Ser 7.820.85 0.44 - 1.00 mg/dL   Calcium 8.9 8.9 - 95.610.3 mg/dL   Total Protein 6.5 6.5 - 8.1 g/dL   Albumin 3.0 (L) 3.5 - 5.0 g/dL   AST 32 15 - 41 U/L   ALT 52 (H) 0 - 44 U/L   Alkaline Phosphatase 66 38 - 126 U/L   Total Bilirubin <0.1 (L) 0.3 - 1.2 mg/dL   GFR calc non Af Amer >60 >60 mL/min   GFR calc Af Amer >60 >60 mL/min   Anion gap 10 5 - 15   MDM:  CT scan wnl, no evidence of PE or other lung abnormality.  Pt reassured by results and reports breathing is better in MAU.  BP mostly wnl with one BP 130s/90s during MAU stay, PEC labs wnl except AST is slightly elevated at 52. Consult Dr Debroah LoopArnold with assessment and findings.  D/C home with PEC precautions and BP check/repeat LFTs in office this week.    A: 1. Hypertension in pregnancy, delivered with postpartum condition   2. Dizziness   3. Anxiety     P: D/C home with outpatient f/u this week, PEC precautions given  Sharen CounterLisa Leftwich-Kirby, CNM 12:49 PM

## 2019-03-12 NOTE — MAU Note (Signed)
Pt had SVD on 11/10, here with complaints of feeling lightheaded since 8:30pm. Reports her BPs have been elevated, ranging 130s-150s/80s-100s. She denies a "constant HA" but reports "random pains in her head". No vision changes or RUQ pain.

## 2019-03-14 DIAGNOSIS — I10 Essential (primary) hypertension: Secondary | ICD-10-CM | POA: Diagnosis not present

## 2019-03-14 DIAGNOSIS — Z308 Encounter for other contraceptive management: Secondary | ICD-10-CM | POA: Diagnosis not present

## 2019-03-16 ENCOUNTER — Other Ambulatory Visit: Payer: Self-pay

## 2019-03-16 DIAGNOSIS — Z20822 Contact with and (suspected) exposure to covid-19: Secondary | ICD-10-CM

## 2019-03-20 DIAGNOSIS — I10 Essential (primary) hypertension: Secondary | ICD-10-CM | POA: Diagnosis not present

## 2019-03-20 LAB — NOVEL CORONAVIRUS, NAA: SARS-CoV-2, NAA: NOT DETECTED

## 2019-03-30 DIAGNOSIS — I10 Essential (primary) hypertension: Secondary | ICD-10-CM | POA: Diagnosis not present

## 2019-03-30 DIAGNOSIS — O139 Gestational [pregnancy-induced] hypertension without significant proteinuria, unspecified trimester: Secondary | ICD-10-CM | POA: Diagnosis not present

## 2019-04-03 ENCOUNTER — Inpatient Hospital Stay (HOSPITAL_COMMUNITY)
Admission: AD | Admit: 2019-04-03 | Discharge: 2019-04-03 | Disposition: A | Discharge status: Home / Self Care | Payer: BC Managed Care – PPO | Attending: Obstetrics & Gynecology | Admitting: Obstetrics & Gynecology

## 2019-04-03 ENCOUNTER — Other Ambulatory Visit: Payer: Self-pay

## 2019-04-03 ENCOUNTER — Encounter (HOSPITAL_COMMUNITY): Payer: Self-pay | Admitting: *Deleted

## 2019-04-03 DIAGNOSIS — Z6841 Body Mass Index (BMI) 40.0 and over, adult: Secondary | ICD-10-CM | POA: Insufficient documentation

## 2019-04-03 DIAGNOSIS — Z91041 Radiographic dye allergy status: Secondary | ICD-10-CM | POA: Insufficient documentation

## 2019-04-03 DIAGNOSIS — I10 Essential (primary) hypertension: Secondary | ICD-10-CM | POA: Insufficient documentation

## 2019-04-03 DIAGNOSIS — R42 Dizziness and giddiness: Secondary | ICD-10-CM | POA: Diagnosis not present

## 2019-04-03 DIAGNOSIS — F419 Anxiety disorder, unspecified: Secondary | ICD-10-CM | POA: Diagnosis not present

## 2019-04-03 DIAGNOSIS — Z8249 Family history of ischemic heart disease and other diseases of the circulatory system: Secondary | ICD-10-CM | POA: Diagnosis not present

## 2019-04-03 DIAGNOSIS — Z79899 Other long term (current) drug therapy: Secondary | ICD-10-CM | POA: Insufficient documentation

## 2019-04-03 LAB — CBC
HCT: 37.5 % (ref 36.0–46.0)
Hemoglobin: 11.5 g/dL — ABNORMAL LOW (ref 12.0–15.0)
MCH: 24.4 pg — ABNORMAL LOW (ref 26.0–34.0)
MCHC: 30.7 g/dL (ref 30.0–36.0)
MCV: 79.4 fL — ABNORMAL LOW (ref 80.0–100.0)
Platelets: 324 10*3/uL (ref 150–400)
RBC: 4.72 MIL/uL (ref 3.87–5.11)
RDW: 15.9 % — ABNORMAL HIGH (ref 11.5–15.5)
WBC: 10.4 10*3/uL (ref 4.0–10.5)
nRBC: 0 % (ref 0.0–0.2)

## 2019-04-03 LAB — URINALYSIS, ROUTINE W REFLEX MICROSCOPIC
Bilirubin Urine: NEGATIVE
Glucose, UA: NEGATIVE mg/dL
Ketones, ur: NEGATIVE mg/dL
Leukocytes,Ua: NEGATIVE
Nitrite: NEGATIVE
Protein, ur: NEGATIVE mg/dL
Specific Gravity, Urine: 1.013 (ref 1.005–1.030)
pH: 6 (ref 5.0–8.0)

## 2019-04-03 MED ORDER — MECLIZINE HCL 25 MG PO TABS: 25.0000 mg | ORAL_TABLET | Freq: Every day | ORAL | 0 refills | Status: DC

## 2019-04-03 NOTE — MAU Note (Signed)
Pt is s/p NSVD on 11/10. She is here intermittent chest pain, dizziness and "labored breathing" . She has chronic HTN, on labetolol 200 BID. She has had elevated pressures  at home. Denies HA or blurry vision.

## 2019-04-03 NOTE — MAU Provider Note (Signed)
History     CSN: [id number]  Arrival date and time: 04/03/19 1647   First Provider Initiated Contact with Patient 04/03/19 1820      Chief Complaint  Patient presents with  . Dizziness   28 y.o. [id number] s/p SVD 4 wks ago presenting with dizziness, chest pain and pressure. Dizziness has been ongoing for the last few weeks. Feels like "I'm on a cruise ship", mostly happens when she is sitting and feels it gets better when she is up and moving. Denies fever or chills. No respiratory sx. Reports elevated BP today 160/100. She took her Labetalol. CP happened today, lasted just a few seconds, felt sharp in center of chest. Reports intermittent chest pressure but this is not a new symptom and she attributes it to her anxiety, poor posture, and heavy breasts.   OB History as of 03/07/2019    Gravida  2   Para  2   Term  2   Preterm      AB      Living  2     SAB      TAB      Ectopic      Multiple  0   Live Births  2           Past Medical History:  Diagnosis Date  . Allergy   . Gestational hypertension 04/27/2014  . Hypertension   . Medical history non-contributory   . Morbid obesity ([facility]) 04/27/2014  . NSVD (normal spontaneous vaginal delivery) 04/28/2014    Past Surgical History:  Procedure Laterality Date  . BREAST SURGERY     2008    Family History  Problem Relation Age of Onset  . Hypertension Mother   . Diabetes Father   . Hypertension Maternal Grandmother   . Diabetes Paternal Grandfather     Social History   Tobacco Use  . Smoking status: Never Smoker  . Smokeless tobacco: Never Used  Substance Use Topics  . Alcohol use: Not Currently    Alcohol/week: 3.0 standard drinks    Types: 3 Standard drinks or equivalent per week  . Drug use: Never    Allergies:  Allergies  Allergen Reactions  . Contrast Media [Iodinated Diagnostic Agents] Hives and Nausea And Vomiting    Medications Prior to Admission  Medication Sig Dispense Refill Last Dose   . ibuprofen (ADVIL) 600 MG tablet Take 1 tablet (600 mg total) by mouth every 6 (six) hours. 30 tablet 0   . labetalol (NORMODYNE) 200 MG tablet Take 1 tablet (200 mg total) by mouth 2 (two) times daily. 60 tablet 1   . Omega-3 Fatty Acids (OMEGA 3 500) 500 MG CAPS Take 2,000 mg by mouth at bedtime. Unsure of dosage      . Prenatal Vit-Fe Fumarate-FA (PRENATAL MULTIVITAMIN) TABS tablet Take 1 tablet by mouth daily at 12 noon.       Review of Systems  Constitutional: Negative for chills and fever.  HENT: Negative for congestion, ear pain, rhinorrhea and sore throat.   Eyes: Negative for visual disturbance.  Respiratory: Positive for chest tightness (chest pressure). Negative for shortness of breath.   Cardiovascular: Positive for chest pain.  Neurological: Positive for dizziness. Negative for syncope, weakness, numbness and headaches.   Physical Exam   Blood pressure 139/89, pulse 73, temperature 98 F (36.7 C), temperature source Oral, resp. rate 20, height 5\' 7"  (1.702 m), weight 128.9 kg, SpO2 100 %, unknown if currently breastfeeding. Patient Vitals for the past  24 hrs:  BP Temp Temp src Pulse Resp SpO2 Height Weight  04/03/19 1937 139/89 - - 73 - - - -  04/03/19 1802 (!) 142/92 98 F (36.7 C) Oral 77 20 - - -  04/03/19 1719 (!) 144/92 98.9 F (37.2 C) Oral 79 - 100 % 5\' 7"  (1.702 m) 128.9 kg    Physical Exam  Nursing note and vitals reviewed. Constitutional: She is oriented to person, place, and time. She appears well-developed and well-nourished. No distress.  HENT:  Head: Normocephalic and atraumatic.  Right Ear: Hearing, tympanic membrane, external ear and ear canal normal.  Left Ear: Hearing, tympanic membrane, external ear and ear canal normal.  Mouth/Throat: Uvula is midline, oropharynx is clear and moist and mucous membranes are normal.  Neck: Normal range of motion.  Cardiovascular: Normal rate, regular rhythm and normal heart sounds.  Respiratory: Effort normal and  breath sounds normal. No respiratory distress. She has no wheezes. She has no rales.  Musculoskeletal: Normal range of motion.        General: No edema.  Neurological: She is alert and oriented to person, place, and time. She has normal strength and normal reflexes. She displays normal reflexes. No cranial nerve deficit. Coordination normal.  Skin: Skin is warm and dry.  Psychiatric: She has a normal mood and affect.   Results for orders placed or performed during the hospital encounter of 04/03/19 (from the past 24 hour(s))  Urinalysis, Routine w reflex microscopic     Status: Abnormal   Collection Time: 04/03/19  5:38 PM  Result Value Ref Range   Color, Urine YELLOW YELLOW   APPearance CLEAR CLEAR   Specific Gravity, Urine 1.013 1.005 - 1.030   pH 6.0 5.0 - 8.0   Glucose, UA NEGATIVE NEGATIVE mg/dL   Hgb urine dipstick SMALL (A) NEGATIVE   Bilirubin Urine NEGATIVE NEGATIVE   Ketones, ur NEGATIVE NEGATIVE mg/dL   Protein, ur NEGATIVE NEGATIVE mg/dL   Nitrite NEGATIVE NEGATIVE   Leukocytes,Ua NEGATIVE NEGATIVE   RBC / HPF 0-5 0 - 5 RBC/hpf   WBC, UA 0-5 0 - 5 WBC/hpf   Bacteria, UA RARE (A) NONE SEEN   Squamous Epithelial / LPF 0-5 0 - 5  CBC     Status: Abnormal   Collection Time: 04/03/19  6:52 PM  Result Value Ref Range   WBC 10.4 4.0 - 10.5 K/uL   RBC 4.72 3.87 - 5.11 MIL/uL   Hemoglobin 11.5 (L) 12.0 - 15.0 g/dL   HCT 16.137.5 09.636.0 - 04.546.0 %   MCV 79.4 (L) 80.0 - 100.0 fL   MCH 24.4 (L) 26.0 - 34.0 pg   MCHC 30.7 30.0 - 36.0 g/dL   RDW 40.915.9 (H) 81.111.5 - 91.415.5 %   Platelets 324 150 - 400 K/uL   nRBC 0.0 0.0 - 0.2 %   MAU Course  Procedures  MDM Labs ordered and reviewed. EKG interpreted by Dr. Crissie ReeseEckstat> NSR, no acute changes. Normal neuro exam. No signs of PE or acute process. Suspect anxiety may be contributing factor. Will treat for vertigo although unclear source. Upon further discussion pt states she is in a high anxiety state at home since Covid and now she is not working  (which keeps her busy), and not getting adequate sleep. She feels this is likely contributing to her CP sx. She denies SI/HI. She has good support at home. She is currently in counseling and is considering anti-anxiety medication which she will discuss with her provider tomorrow. Recommend  different anti-hypertensive med for better BP control. She is stable for discharge home.   Assessment and Plan  Vertigo Anxiety Essential HTN Discharge home Follow up at [facility] tomorrow as scheduled Rx Meclizine Return precautions  Allergies as of 04/03/2019      Reactions   Contrast Media [iodinated Diagnostic Agents] Hives, Nausea And Vomiting      Medication List    TAKE these medications   ibuprofen 600 MG tablet Commonly known as: ADVIL Take 1 tablet (600 mg total) by mouth every 6 (six) hours.   labetalol 200 MG tablet Commonly known as: NORMODYNE Take 1 tablet (200 mg total) by mouth 2 (two) times daily.   meclizine 25 MG tablet Commonly known as: ANTIVERT Take 1 tablet (25 mg total) by mouth daily.   Omega 3 500 500 MG Caps Take 2,000 mg by mouth at bedtime. Unsure of dosage   prenatal multivitamin Tabs tablet Take 1 tablet by mouth daily at 12 noon.      Julianne Handler, CNM 04/03/2019, 7:48 PM

## 2019-04-03 NOTE — Discharge Instructions (Signed)

## 2019-04-04 DIAGNOSIS — R42 Dizziness and giddiness: Secondary | ICD-10-CM | POA: Diagnosis not present

## 2019-04-06 DIAGNOSIS — I10 Essential (primary) hypertension: Secondary | ICD-10-CM | POA: Diagnosis not present

## 2019-04-14 DIAGNOSIS — Z3040 Encounter for surveillance of contraceptives, unspecified: Secondary | ICD-10-CM | POA: Diagnosis not present

## 2019-04-14 DIAGNOSIS — F419 Anxiety disorder, unspecified: Secondary | ICD-10-CM | POA: Diagnosis not present

## 2019-04-17 DIAGNOSIS — F411 Generalized anxiety disorder: Secondary | ICD-10-CM | POA: Diagnosis not present

## 2019-04-18 ENCOUNTER — Ambulatory Visit: Payer: BC Managed Care – PPO | Attending: Internal Medicine

## 2019-04-18 DIAGNOSIS — R238 Other skin changes: Secondary | ICD-10-CM | POA: Diagnosis not present

## 2019-04-18 DIAGNOSIS — U071 COVID-19: Secondary | ICD-10-CM

## 2019-04-19 LAB — NOVEL CORONAVIRUS, NAA: SARS-CoV-2, NAA: NOT DETECTED

## 2019-04-25 DIAGNOSIS — F411 Generalized anxiety disorder: Secondary | ICD-10-CM | POA: Diagnosis not present

## 2019-04-26 ENCOUNTER — Telehealth: Payer: Self-pay

## 2019-04-26 ENCOUNTER — Ambulatory Visit: Payer: BC Managed Care – PPO | Attending: Internal Medicine

## 2019-04-26 DIAGNOSIS — Z20828 Contact with and (suspected) exposure to other viral communicable diseases: Secondary | ICD-10-CM | POA: Diagnosis not present

## 2019-04-26 DIAGNOSIS — Z20822 Contact with and (suspected) exposure to covid-19: Secondary | ICD-10-CM

## 2019-04-26 NOTE — Telephone Encounter (Signed)
Pt notified of negative COVID-19 results. Understanding verbalized.  Chasta M Hopkins   

## 2019-04-28 HISTORY — PX: OTHER SURGICAL HISTORY: SHX169

## 2019-04-28 LAB — NOVEL CORONAVIRUS, NAA: SARS-CoV-2, NAA: NOT DETECTED

## 2019-04-29 DIAGNOSIS — F411 Generalized anxiety disorder: Secondary | ICD-10-CM | POA: Diagnosis not present

## 2019-05-01 DIAGNOSIS — Z0130 Encounter for examination of blood pressure without abnormal findings: Secondary | ICD-10-CM | POA: Diagnosis not present

## 2019-05-03 ENCOUNTER — Emergency Department (HOSPITAL_COMMUNITY): Payer: BC Managed Care – PPO

## 2019-05-03 ENCOUNTER — Other Ambulatory Visit: Payer: Self-pay

## 2019-05-03 ENCOUNTER — Emergency Department (HOSPITAL_COMMUNITY)
Admission: EM | Admit: 2019-05-03 | Discharge: 2019-05-03 | Disposition: A | Discharge status: Home / Self Care | Payer: BC Managed Care – PPO | Attending: Emergency Medicine | Admitting: Emergency Medicine

## 2019-05-03 ENCOUNTER — Encounter (HOSPITAL_COMMUNITY): Payer: Self-pay

## 2019-05-03 DIAGNOSIS — M79602 Pain in left arm: Secondary | ICD-10-CM | POA: Insufficient documentation

## 2019-05-03 DIAGNOSIS — R079 Chest pain, unspecified: Secondary | ICD-10-CM | POA: Insufficient documentation

## 2019-05-03 DIAGNOSIS — Z5321 Procedure and treatment not carried out due to patient leaving prior to being seen by health care provider: Secondary | ICD-10-CM | POA: Diagnosis not present

## 2019-05-03 LAB — CBC
HCT: 41.9 % (ref 36.0–46.0)
Hemoglobin: 12.6 g/dL (ref 12.0–15.0)
MCH: 24.6 pg — ABNORMAL LOW (ref 26.0–34.0)
MCHC: 30.1 g/dL (ref 30.0–36.0)
MCV: 81.8 fL (ref 80.0–100.0)
Platelets: 329 10*3/uL (ref 150–400)
RBC: 5.12 MIL/uL — ABNORMAL HIGH (ref 3.87–5.11)
RDW: 16.5 % — ABNORMAL HIGH (ref 11.5–15.5)
WBC: 8.3 10*3/uL (ref 4.0–10.5)
nRBC: 0 % (ref 0.0–0.2)

## 2019-05-03 LAB — TROPONIN I (HIGH SENSITIVITY): Troponin I (High Sensitivity): <2 ng/L (ref <18)

## 2019-05-03 LAB — BASIC METABOLIC PANEL
Anion gap: 9 (ref 5–15)
BUN: 16 mg/dL (ref 6–20)
CO2: 24 mmol/L (ref 22–32)
Calcium: 9.8 mg/dL (ref 8.9–10.3)
Chloride: 108 mmol/L (ref 98–111)
Creatinine, Ser: 0.88 mg/dL (ref 0.44–1.00)
GFR calc Af Amer: >60 mL/min (ref >60)
GFR calc non Af Amer: >60 mL/min (ref >60)
Glucose, Bld: 80 mg/dL (ref 70–99)
Potassium: 4.2 mmol/L (ref 3.5–5.1)
Sodium: 141 mmol/L (ref 135–145)

## 2019-05-03 LAB — I-STAT BETA HCG BLOOD, ED (NOT ORDERABLE): I-stat hCG, quantitative: <5 m[IU]/mL (ref <5)

## 2019-05-03 MED ORDER — SODIUM CHLORIDE 0.9% FLUSH: 3.0000 mL | Freq: Once | INTRAVENOUS | Status: DC

## 2019-05-03 NOTE — ED Notes (Signed)
Pt sts she is going home and will follow up with her PCP tom

## 2019-05-03 NOTE — ED Triage Notes (Signed)
Pt c/o chest pressure in the center of her chest that has been going on x2 days. Pt is 8 weeks post partum. Pt OB prescribed anxiety medication, pt states she has not taken it. Pt states that she was seen during pregnancy by cardiology due to heart palpations. Pt endorses soreness and pain that radiates to her jaw and left arm.

## 2019-05-04 ENCOUNTER — Ambulatory Visit: Payer: Self-pay | Admitting: Nurse Practitioner

## 2019-05-04 ENCOUNTER — Ambulatory Visit: Payer: BC Managed Care – PPO | Attending: Internal Medicine

## 2019-05-04 ENCOUNTER — Telehealth: Payer: Self-pay

## 2019-05-04 DIAGNOSIS — I10 Essential (primary) hypertension: Secondary | ICD-10-CM | POA: Diagnosis not present

## 2019-05-04 DIAGNOSIS — Z20822 Contact with and (suspected) exposure to covid-19: Secondary | ICD-10-CM

## 2019-05-04 DIAGNOSIS — F411 Generalized anxiety disorder: Secondary | ICD-10-CM | POA: Diagnosis not present

## 2019-05-04 DIAGNOSIS — F418 Other specified anxiety disorders: Secondary | ICD-10-CM | POA: Diagnosis not present

## 2019-05-04 DIAGNOSIS — Z6841 Body Mass Index (BMI) 40.0 and over, adult: Secondary | ICD-10-CM | POA: Diagnosis not present

## 2019-05-04 NOTE — Telephone Encounter (Signed)
I called patient because she seen she got tested for covid so I advised pt that we would have to reschedule her for a different day due to her being a new pt and not being able to be seen virtual. She is to call us once she receives her results so we can schedule her an appt. YRL,RMA

## 2019-05-05 ENCOUNTER — Emergency Department (HOSPITAL_COMMUNITY): Payer: BC Managed Care – PPO

## 2019-05-05 ENCOUNTER — Encounter (HOSPITAL_COMMUNITY): Payer: Self-pay | Admitting: Emergency Medicine

## 2019-05-05 ENCOUNTER — Other Ambulatory Visit: Payer: Self-pay

## 2019-05-05 DIAGNOSIS — I10 Essential (primary) hypertension: Secondary | ICD-10-CM | POA: Diagnosis not present

## 2019-05-05 DIAGNOSIS — R079 Chest pain, unspecified: Secondary | ICD-10-CM | POA: Diagnosis not present

## 2019-05-05 DIAGNOSIS — Z79899 Other long term (current) drug therapy: Secondary | ICD-10-CM | POA: Diagnosis not present

## 2019-05-05 DIAGNOSIS — F419 Anxiety disorder, unspecified: Secondary | ICD-10-CM | POA: Diagnosis not present

## 2019-05-05 DIAGNOSIS — R072 Precordial pain: Secondary | ICD-10-CM | POA: Diagnosis not present

## 2019-05-05 LAB — CBC
HCT: 44.2 % (ref 36.0–46.0)
Hemoglobin: 13.2 g/dL (ref 12.0–15.0)
MCH: 24.4 pg — ABNORMAL LOW (ref 26.0–34.0)
MCHC: 29.9 g/dL — ABNORMAL LOW (ref 30.0–36.0)
MCV: 81.9 fL (ref 80.0–100.0)
Platelets: 324 10*3/uL (ref 150–400)
RBC: 5.4 MIL/uL — ABNORMAL HIGH (ref 3.87–5.11)
RDW: 16.6 % — ABNORMAL HIGH (ref 11.5–15.5)
WBC: 10.1 10*3/uL (ref 4.0–10.5)
nRBC: 0 % (ref 0.0–0.2)

## 2019-05-05 LAB — I-STAT BETA HCG BLOOD, ED (NOT ORDERABLE): I-stat hCG, quantitative: <5 m[IU]/mL (ref <5)

## 2019-05-05 NOTE — ED Triage Notes (Addendum)
Patient c/o intermittent chest pressure with palpitations radiating to left neck and arm since taking amlodipine today for the first time. Speaking in full sentences without difficulty.

## 2019-05-06 ENCOUNTER — Encounter (HOSPITAL_COMMUNITY): Payer: Self-pay | Admitting: Emergency Medicine

## 2019-05-06 ENCOUNTER — Emergency Department (HOSPITAL_COMMUNITY)
Admission: EM | Admit: 2019-05-06 | Discharge: 2019-05-06 | Disposition: A | Discharge status: Home / Self Care | Payer: BC Managed Care – PPO | Attending: Emergency Medicine | Admitting: Emergency Medicine

## 2019-05-06 DIAGNOSIS — F419 Anxiety disorder, unspecified: Secondary | ICD-10-CM

## 2019-05-06 DIAGNOSIS — I10 Essential (primary) hypertension: Secondary | ICD-10-CM | POA: Diagnosis not present

## 2019-05-06 DIAGNOSIS — Z79899 Other long term (current) drug therapy: Secondary | ICD-10-CM | POA: Diagnosis not present

## 2019-05-06 DIAGNOSIS — R072 Precordial pain: Secondary | ICD-10-CM | POA: Diagnosis not present

## 2019-05-06 LAB — D-DIMER, QUANTITATIVE: D-Dimer, Quant: 0.3 ug/mL-FEU (ref 0.00–0.50)

## 2019-05-06 LAB — BASIC METABOLIC PANEL
Anion gap: 11 (ref 5–15)
BUN: 14 mg/dL (ref 6–20)
CO2: 23 mmol/L (ref 22–32)
Calcium: 9.9 mg/dL (ref 8.9–10.3)
Chloride: 106 mmol/L (ref 98–111)
Creatinine, Ser: 0.93 mg/dL (ref 0.44–1.00)
GFR calc Af Amer: >60 mL/min (ref >60)
GFR calc non Af Amer: >60 mL/min (ref >60)
Glucose, Bld: 84 mg/dL (ref 70–99)
Potassium: 4.2 mmol/L (ref 3.5–5.1)
Sodium: 140 mmol/L (ref 135–145)

## 2019-05-06 LAB — TROPONIN I (HIGH SENSITIVITY)
Troponin I (High Sensitivity): <2 ng/L (ref <18)
Troponin I (High Sensitivity): <2 ng/L (ref <18)

## 2019-05-06 NOTE — ED Provider Notes (Signed)
[facility] DEPT Provider Note   CSN: [id number] Arrival date & time: 05/05/19  2203     History Chief Complaint  Patient presents with  . Chest Pain    Sarah Giles is a 29 y.o. female.  The history is provided by the patient.  Chest Pain Pain location:  Substernal area Pain quality: dull   Pain radiates to:  Does not radiate Pain severity:  Moderate Onset quality:  Gradual Timing:  Constant Progression:  Waxing and waning Chronicity:  New Context: at rest   Worsened by:  Nothing Ineffective treatments:  None tried Associated symptoms: anxiety   Associated symptoms: no abdominal pain, no back pain, no cough, no diaphoresis, no dizziness, no dysphagia, no fatigue, no fever, no headache, no heartburn, no lower extremity edema, no nausea, no near-syncope, no numbness, no orthopnea, no palpitations, no PND, no shortness of breath, no syncope, no vomiting and no weakness   Associated symptoms comment:  Feeling hot all over. Patient states today's episode was caused by changing BP medication.   Risk factors: hypertension   Risk factors: no aortic disease, not pregnant and no prior DVT/PE   Patient with anxiety present with waxing and waning chest pain and feeling hot all over that started a week ago.  States she was told it was not her heart a week ago but changed BP medication today and felt similar symptoms.  No f/c/r.       Past Medical History:  Diagnosis Date  . Allergy   . Gestational hypertension 04/27/2014  . Hypertension   . Medical history non-contributory   . Morbid obesity ([place]) 04/27/2014  . NSVD (normal spontaneous vaginal delivery) 04/28/2014    Patient Active Problem List   Diagnosis Date Noted  . SVD (11/10) 03/07/2019  . Chronic hypertension with exacerbation during pregnancy in third trimester 03/06/2019  . Palpitations 11/08/2018  . Dizziness 11/08/2018  . HTN (hypertension) 11/08/2018  . NSVD (normal spontaneous vaginal  delivery) 04/28/2014  . Morbid obesity ([place]) 04/27/2014    Past Surgical History:  Procedure Laterality Date  . BREAST SURGERY     2008     OB History    Gravida  2   Para  2   Term  2   Preterm      AB      Living  2     SAB      TAB      Ectopic      Multiple  0   Live Births  2           Family History  Problem Relation Age of Onset  . Hypertension Mother   . Diabetes Father   . Hypertension Maternal Grandmother   . Diabetes Paternal Grandfather     Social History   Tobacco Use  . Smoking status: Never Smoker  . Smokeless tobacco: Never Used  Substance Use Topics  . Alcohol use: Not Currently    Alcohol/week: 3.0 standard drinks    Types: 3 Standard drinks or equivalent per week  . Drug use: Never    Home Medications Prior to Admission medications   Medication Sig Start Date End Date Taking? Authorizing Provider  amLODipine (NORVASC) 5 MG tablet Take 5 mg by mouth daily. 05/04/19  Yes [provider]  ibuprofen (ADVIL) 200 MG tablet Take 400 mg by mouth every 6 (six) hours as needed for moderate pain.   Yes [provider]  labetalol (NORMODYNE) 200 MG  tablet Take 1 tablet (200 mg total) by mouth 2 (two) times daily. Patient taking differently: Take 100 mg by mouth every evening.  03/08/19 05/07/19 Yes Roma Schanz, CNM  Omega-3 Fatty Acids (OMEGA 3 500) 500 MG CAPS Take 2,000 mg by mouth at bedtime. Unsure of dosage    Yes [provider]  ibuprofen (ADVIL) 600 MG tablet Take 1 tablet (600 mg total) by mouth every 6 (six) hours. Patient not taking: Reported on 05/06/2019 03/08/19   Roma Schanz, CNM  meclizine (ANTIVERT) 25 MG tablet Take 1 tablet (25 mg total) by mouth daily. Patient not taking: Reported on 05/06/2019 04/03/19   Donette Larry, CNM    Allergies    Contrast media [iodinated diagnostic agents]  Review of Systems   Review of Systems  Constitutional: Negative for diaphoresis, fatigue and fever.    HENT: Negative for trouble swallowing.   Eyes: Negative for visual disturbance.  Respiratory: Negative for cough and shortness of breath.   Cardiovascular: Positive for chest pain. Negative for palpitations, orthopnea, leg swelling, syncope, PND and near-syncope.  Gastrointestinal: Negative for abdominal pain, heartburn, nausea and vomiting.  Genitourinary: Negative for difficulty urinating.  Musculoskeletal: Negative for back pain.  Neurological: Negative for dizziness, weakness, numbness and headaches.  Psychiatric/Behavioral: Negative for agitation.  All other systems reviewed and are negative.   Physical Exam Updated Vital Signs BP (!) 165/112 (BP Location: Right Arm)   Pulse 79   Temp 98.9 F (37.2 C) (Oral)   Resp 18   LMP 04/12/2019   SpO2 96%   Physical Exam Vitals and nursing note reviewed.  Constitutional:      Appearance: Normal appearance. She is not diaphoretic.  HENT:     Head: Normocephalic and atraumatic.     Nose: Nose normal.  Eyes:     Conjunctiva/sclera: Conjunctivae normal.     Pupils: Pupils are equal, round, and reactive to light.  Cardiovascular:     Rate and Rhythm: Normal rate and regular rhythm.     Pulses: Normal pulses.     Heart sounds: Normal heart sounds.  Pulmonary:     Effort: Pulmonary effort is normal.     Breath sounds: Normal breath sounds.  Abdominal:     General: Abdomen is flat. Bowel sounds are normal.     Tenderness: There is no abdominal tenderness. There is no guarding or rebound.  Musculoskeletal:        General: Normal range of motion.     Cervical back: Normal range of motion and neck supple.  Skin:    General: Skin is warm and dry.     Capillary Refill: Capillary refill takes less than 2 seconds.  Neurological:     General: No focal deficit present.     Mental Status: She is alert and oriented to person, place, and time.     Deep Tendon Reflexes: Reflexes normal.  Psychiatric:        Mood and Affect: Mood is  anxious.     ED Results / Procedures / Treatments   Labs (all labs ordered are listed, but only abnormal results are displayed) Results for orders placed or performed during the hospital encounter of 05/06/19  Basic metabolic panel  Result Value Ref Range   Sodium 140 135 - 145 mmol/L   Potassium 4.2 3.5 - 5.1 mmol/L   Chloride 106 98 - 111 mmol/L   CO2 23 22 - 32 mmol/L   Glucose, Bld 84 70 - 99 mg/dL  BUN 14 6 - 20 mg/dL   Creatinine, Ser 1.74 0.44 - 1.00 mg/dL   Calcium 9.9 8.9 - 08.1 mg/dL   GFR calc non Af Amer >60 >60 mL/min   GFR calc Af Amer >60 >60 mL/min   Anion gap 11 5 - 15  CBC  Result Value Ref Range   WBC 10.1 4.0 - 10.5 K/uL   RBC 5.40 (H) 3.87 - 5.11 MIL/uL   Hemoglobin 13.2 12.0 - 15.0 g/dL   HCT 44.8 18.5 - 63.1 %   MCV 81.9 80.0 - 100.0 fL   MCH 24.4 (L) 26.0 - 34.0 pg   MCHC 29.9 (L) 30.0 - 36.0 g/dL   RDW 49.7 (H) 02.6 - 37.8 %   Platelets 324 150 - 400 K/uL   nRBC 0.0 0.0 - 0.2 %  I-Stat beta hCG blood, ED  Result Value Ref Range   I-stat hCG, quantitative <5.0 <5 mIU/mL   Comment 3          Troponin I (High Sensitivity)  Result Value Ref Range   Troponin I (High Sensitivity) <2 <18 ng/L   DG Chest 2 View  Result Date: 05/05/2019 CLINICAL DATA:  Chest pain and pressure EXAM: CHEST - 2 VIEW COMPARISON:  05/03/2019 FINDINGS: The heart size and mediastinal contours are within normal limits. Both lungs are clear. The visualized skeletal structures are unremarkable. IMPRESSION: No active cardiopulmonary disease. Electronically Signed   By: Alcide Clever M.D.   On: 05/05/2019 23:03   DG Chest 2 View  Result Date: 05/03/2019 CLINICAL DATA:  Chest pain EXAM: CHEST - 2 VIEW COMPARISON:  10/04/2015 FINDINGS: The heart size and mediastinal contours are within normal limits. Both lungs are clear. The visualized skeletal structures are unremarkable. IMPRESSION: No active cardiopulmonary disease. Electronically Signed   By: Judie Petit.  Shick M.D.   On: 05/03/2019 17:01      EKG EKG Interpretation  Date/Time:  Friday May 05 2019 22:16:52 EST Ventricular Rate:  81 PR Interval:    QRS Duration: 93 QT Interval:  369 QTC Calculation: 429 R Axis:   75 Text Interpretation: Sinus rhythm Confirmed by Gowri Suchan ([id number]) on 05/05/2019 11:47:30 PM   Radiology DG Chest 2 View  Result Date: 05/05/2019 CLINICAL DATA:  Chest pain and pressure EXAM: CHEST - 2 VIEW COMPARISON:  05/03/2019 FINDINGS: The heart size and mediastinal contours are within normal limits. Both lungs are clear. The visualized skeletal structures are unremarkable. IMPRESSION: No active cardiopulmonary disease. Electronically Signed   By: Alcide Clever M.D.   On: 05/05/2019 23:03    Procedures Procedures (including critical care time)  Medications Ordered in ED Medications - No data to display  ED Course  I have reviewed the triage vital signs and the nursing notes.  Pertinent labs & imaging results that were available during my care of the patient were reviewed by me and considered in my medical decision making (see chart for details).    MDM Rules/Calculators/A&P Ruled out for MI in the ED heart score is 1 very low risk for mace.  Ruled out for PE with low risk story and negative ddimer.  Symptoms are most consistent with anxiety.  Follow up with your PMD.  Based on the JNC 7 there is no indication for acute lowering of BP in the ED.  Patient is instructed to follow up with her PMD and cardiologist for ongoing care.   Sarah Giles was evaluated in Emergency Department on 05/06/2019 for the symptoms described in  the history of present illness. She was evaluated in the context of the global COVID-19 pandemic, which necessitated consideration that the patient might be at risk for infection with the SARS-CoV-2 virus that causes COVID-19. Institutional protocols and algorithms that pertain to the evaluation of patients at risk for COVID-19 are in a state of rapid change based on information  released by regulatory bodies including the CDC and federal and state organizations. These policies and algorithms were followed during the patient's care in the ED.   Final Clinical Impression(s) / ED Diagnoses Return for intractable cough, coughing up blood,fevers >100.4 unrelieved by medication, shortness of breath, intractable vomiting, chest pain, shortness of breath, weakness,numbness, changes in speech, facial asymmetry,abdominal pain, passing out,Inability to tolerate liquids or food, cough, altered mental status or any concerns. No signs of systemic illness or infection. The patient is nontoxic-appearing on exam and vital signs are within normal limits.   I have reviewed the triage vital signs and the nursing notes. Pertinent labs &imaging results that were available during my care of the patient were reviewed by me and considered in my medical decision making (see chart for details).  After history, exam, and medical workup I feel the patient has been appropriately medically screened and is safe for discharge home. Pertinent diagnoses were discussed with the patient. Patient was given return   Sarah Lobosco, MD 05/06/19 0210

## 2019-05-07 LAB — NOVEL CORONAVIRUS, NAA: SARS-CoV-2, NAA: NOT DETECTED

## 2019-05-08 DIAGNOSIS — Z30430 Encounter for insertion of intrauterine contraceptive device: Secondary | ICD-10-CM | POA: Diagnosis not present

## 2019-05-08 DIAGNOSIS — Z113 Encounter for screening for infections with a predominantly sexual mode of transmission: Secondary | ICD-10-CM | POA: Diagnosis not present

## 2019-05-09 ENCOUNTER — Ambulatory Visit: Payer: BC Managed Care – PPO | Admitting: Cardiovascular Disease

## 2019-05-09 DIAGNOSIS — Z862 Personal history of diseases of the blood and blood-forming organs and certain disorders involving the immune mechanism: Secondary | ICD-10-CM | POA: Diagnosis not present

## 2019-05-09 DIAGNOSIS — F411 Generalized anxiety disorder: Secondary | ICD-10-CM | POA: Diagnosis not present

## 2019-05-09 DIAGNOSIS — E559 Vitamin D deficiency, unspecified: Secondary | ICD-10-CM | POA: Diagnosis not present

## 2019-05-09 DIAGNOSIS — F418 Other specified anxiety disorders: Secondary | ICD-10-CM | POA: Diagnosis not present

## 2019-05-09 DIAGNOSIS — I10 Essential (primary) hypertension: Secondary | ICD-10-CM | POA: Diagnosis not present

## 2019-05-10 DIAGNOSIS — H6982 Other specified disorders of Eustachian tube, left ear: Secondary | ICD-10-CM | POA: Diagnosis not present

## 2019-05-11 ENCOUNTER — Ambulatory Visit: Payer: BC Managed Care – PPO | Attending: Internal Medicine

## 2019-05-11 DIAGNOSIS — Z20822 Contact with and (suspected) exposure to covid-19: Secondary | ICD-10-CM

## 2019-05-13 DIAGNOSIS — F419 Anxiety disorder, unspecified: Secondary | ICD-10-CM | POA: Insufficient documentation

## 2019-05-13 LAB — NOVEL CORONAVIRUS, NAA: SARS-CoV-2, NAA: NOT DETECTED

## 2019-05-16 DIAGNOSIS — F411 Generalized anxiety disorder: Secondary | ICD-10-CM | POA: Diagnosis not present

## 2019-05-19 ENCOUNTER — Ambulatory Visit: Payer: BC Managed Care – PPO | Attending: Internal Medicine

## 2019-05-19 DIAGNOSIS — Z20822 Contact with and (suspected) exposure to covid-19: Secondary | ICD-10-CM

## 2019-05-20 LAB — NOVEL CORONAVIRUS, NAA: SARS-CoV-2, NAA: NOT DETECTED

## 2019-05-21 DIAGNOSIS — F411 Generalized anxiety disorder: Secondary | ICD-10-CM | POA: Diagnosis not present

## 2019-05-22 DIAGNOSIS — F418 Other specified anxiety disorders: Secondary | ICD-10-CM | POA: Diagnosis not present

## 2019-05-22 DIAGNOSIS — E559 Vitamin D deficiency, unspecified: Secondary | ICD-10-CM | POA: Diagnosis not present

## 2019-05-22 DIAGNOSIS — Z862 Personal history of diseases of the blood and blood-forming organs and certain disorders involving the immune mechanism: Secondary | ICD-10-CM | POA: Diagnosis not present

## 2019-05-22 DIAGNOSIS — I10 Essential (primary) hypertension: Secondary | ICD-10-CM | POA: Diagnosis not present

## 2019-05-24 DIAGNOSIS — Z20822 Contact with and (suspected) exposure to covid-19: Secondary | ICD-10-CM | POA: Diagnosis not present

## 2019-05-24 DIAGNOSIS — R519 Headache, unspecified: Secondary | ICD-10-CM | POA: Diagnosis not present

## 2019-05-29 ENCOUNTER — Ambulatory Visit: Payer: BC Managed Care – PPO

## 2019-06-02 ENCOUNTER — Ambulatory Visit: Payer: BC Managed Care – PPO | Attending: Internal Medicine

## 2019-06-02 DIAGNOSIS — Z20822 Contact with and (suspected) exposure to covid-19: Secondary | ICD-10-CM

## 2019-06-03 DIAGNOSIS — F411 Generalized anxiety disorder: Secondary | ICD-10-CM | POA: Diagnosis not present

## 2019-06-03 LAB — NOVEL CORONAVIRUS, NAA: SARS-CoV-2, NAA: NOT DETECTED

## 2019-06-04 DIAGNOSIS — F411 Generalized anxiety disorder: Secondary | ICD-10-CM | POA: Diagnosis not present

## 2019-06-04 DIAGNOSIS — F418 Other specified anxiety disorders: Secondary | ICD-10-CM | POA: Diagnosis not present

## 2019-06-07 ENCOUNTER — Encounter: Payer: Self-pay | Admitting: Cardiovascular Disease

## 2019-06-07 ENCOUNTER — Ambulatory Visit: Payer: BC Managed Care – PPO | Admitting: Cardiovascular Disease

## 2019-06-07 ENCOUNTER — Other Ambulatory Visit: Payer: Self-pay

## 2019-06-07 VITALS — BP 134/90 | HR 87 | Ht 67.0 in | Wt 274.1 lb

## 2019-06-07 DIAGNOSIS — I10 Essential (primary) hypertension: Secondary | ICD-10-CM

## 2019-06-07 MED ORDER — PROPRANOLOL HCL 10 MG PO TABS: 10.0000 mg | ORAL_TABLET | Freq: Four times a day (QID) | ORAL | 1 refills | Status: DC | PRN

## 2019-06-07 NOTE — Progress Notes (Signed)
Cardiology Office Note:    Date:  06/07/2019   ID:  Sarah Giles, DOB 11-04-1990, MRN [id number]  PCP:  Kristen Loader, FNP  Cardiologist:  Mertie Moores, MD  Electrophysiologist:  None   Referring MD: Kristen Loader, FNP   Chief Complaint  Patient presents with  . Hypertension    November 08, 2018    Sarah Giles is a 29 y.o. female with a hx of dyspnea , HTN  She is [redacted] weeks pregnant.   We are asked to see her today by Dr. brake for further evaluation of these palpitations and dizziness.  Changed from HCTZ to Labetalol with pregnancy    3 weeks ago, she woke up very short of breath. Couldn't breath, lightheadedness. Went to the ER  Work up was normal .  HR was very fast  Bought a pulse oxymeter  Was very nervous,   Occurs with sitting or lying down. ,  Feels great when standing   her OB has suggested hypoglycemia as a possible dx She has been eating more protein and feels   Has not been exercising  Works at [employer]   This is 2nd pregnancy,   No complications with 1st pregnancy  Had marked HTN on day of delivery   Hx of    June 07, 2019:   Sarah Giles is seen today for follow-up.  Delivered baby boy on Nov. 10.  Has lost some weight  bp has been better recently  Has lots of anxiety symptoms .    A friend of hers died of CHF recently .   She developed chest heaviness and tachycardia  She is now on HCTZ 25 mg  A day .  Is off her amlodipine and labetalol. BP is typically well controlled.    Past Medical History:  Diagnosis Date  . Allergy   . Gestational hypertension 04/27/2014  . Hypertension   . Medical history non-contributory   . Morbid obesity ([place]) 04/27/2014  . NSVD (normal spontaneous vaginal delivery) 04/28/2014    Past Surgical History:  Procedure Laterality Date  . BREAST SURGERY     2008    Current Medications: No outpatient medications have been marked as taking for the 06/07/19 encounter (Office Visit) with  Marquisa Salih, Wonda Cheng, MD.     Allergies:   Contrast media [iodinated diagnostic agents]   Social History   Socioeconomic History  . Marital status: Unknown    Spouse name: Not on file  . Number of children: Not on file  . Years of education: Not on file  . Highest education level: Not on file  Occupational History  . Not on file  Tobacco Use  . Smoking status: Never Smoker  . Smokeless tobacco: Never Used  Substance and Sexual Activity  . Alcohol use: Not Currently    Alcohol/week: 3.0 standard drinks    Types: 3 Standard drinks or equivalent per week  . Drug use: Never  . Sexual activity: Yes    Birth control/protection: None  Other Topics Concern  . Not on file  Social History Narrative  . Not on file   Social Determinants of Health   Financial Resource Strain:   . Difficulty of Paying Living Expenses: Not on file  Food Insecurity:   . Worried About Charity fundraiser in the Last Year: Not on file  . Ran Out of Food in the Last Year: Not on file  Transportation Needs:   . Lack  of Transportation (Medical): Not on file  . Lack of Transportation (Non-Medical): Not on file  Physical Activity:   . Days of Exercise per Week: Not on file  . Minutes of Exercise per Session: Not on file  Stress:   . Feeling of Stress : Not on file  Social Connections:   . Frequency of Communication with Friends and Family: Not on file  . Frequency of Social Gatherings with Friends and Family: Not on file  . Attends Religious Services: Not on file  . Active Member of Clubs or Organizations: Not on file  . Attends Banker Meetings: Not on file  . Marital Status: Not on file     Family History: The patient's family history includes Diabetes in her father and paternal grandfather; Hypertension in her maternal grandmother and mother.  ROS:   Please see the history of present illness.     All other systems reviewed and are negative.  EKGs/Labs/Other Studies Reviewed:    The  following studies were reviewed today:   EKG:  EKG   - Sep 14, 2018,  NSR at 85.  Recent Labs: 03/12/2019: ALT 52 05/05/2019: BUN 14; Creatinine, Ser 0.93; Hemoglobin 13.2; Platelets 324; Potassium 4.2; Sodium 140  Recent Lipid Panel No results found for: CHOL, TRIG, HDL, CHOLHDL, VLDL, LDLCALC, LDLDIRECT  Physical Exam:    VS:  BP 134/90   Pulse 87   Ht 5\' 7"  (1.702 m)   Wt 274 lb 1.9 oz (124.3 kg)   SpO2 100%   BMI 42.93 kg/m     Wt Readings from Last 3 Encounters:  06/07/19 274 lb 1.9 oz (124.3 kg)  05/03/19 275 lb (124.7 kg)  04/03/19 284 lb 1.6 oz (128.9 kg)     GEN: Young female, moderately to morbidly obese. HEENT: Normal NECK: No JVD; No carotid bruits LYMPHATICS: No lymphadenopathy CARDIAC regular rate, S1-S2, no significant murmurs RESPIRATORY:  Clear to auscultation without rales, wheezing or rhonchi  ABDOMEN: Soft, non-tender, non-distended MUSCULOSKELETAL:  No edema; No deformity  SKIN: Warm and dry NEUROLOGIC:  Alert and oriented x 3 PSYCHIATRIC:  Normal affect   ASSESSMENT:    No diagnosis found. PLAN:    In order of problems listed above:  1. Palpitations:   Has an anxiety related to her palpitatons   Will try propranolol  10 mg QID prn    2.  Anxiety:   Gave her 14/07/20 [facility] name    I will see her again in  6 months     Medication Adjustments/Labs and Tests Ordered: Current medicines are reviewed at length with the patient today.  Concerns regarding medicines are outlined above.  No orders of the defined types were placed in this encounter.  Meds ordered this encounter  Medications  . propranolol (INDERAL) 10 MG tablet    Sig: Take 1 tablet (10 mg total) by mouth 4 (four) times daily as needed.    Dispense:  120 tablet    Refill:  1    Patient Instructions  Medication Instructions:  1) You may take Propranolol 10mg  when you are having palpitations.  Do not take more than four tablets a day.    *If you need a refill on your  cardiac medications before your next appointment, please call your pharmacy*  Lab Work: None If you have labs (blood work) drawn today and your tests are completely normal, you will receive your results only by: Kriste Basque MyChart Message (if you have MyChart) OR . A paper  copy in the mail If you have any lab test that is abnormal or we need to change your treatment, we will call you to review the results.  Testing/Procedures: None  Follow-Up: At [facility], you and your health needs are our priority.  As part of our continuing mission to provide you with exceptional heart care, we have created designated Provider Care Teams.  These Care Teams include your primary Cardiologist (physician) and Advanced Practice Providers (APPs -  Physician Assistants and Nurse Practitioners) who all work together to provide you with the care you need, when you need it.  Your next appointment:   6 month(s)  The format for your next appointment:   In Person  Provider:   You may see Kristeen Miss, MD or one of the following Advanced Practice Providers on your designated Care Team:    Tereso Newcomer, PA-C  Vin [place], [place]  Berton Bon, NP   Other Instructions     Emiliano Dyer  [facility] [phone]      Signed, Kristeen Miss, MD  06/07/2019 5:31 PM    [facility]

## 2019-06-07 NOTE — Patient Instructions (Addendum)
Medication Instructions:  1) You may take Propranolol 10mg  when you are having palpitations.  Do not take more than four tablets a day.    *If you need a refill on your cardiac medications before your next appointment, please call your pharmacy*  Lab Work: None If you have labs (blood work) drawn today and your tests are completely normal, you will receive your results only by: MyChart Message (if you have MyChart) OR . A paper copy in the mail If you have any lab test that is abnormal or we need to change your treatment, we will call you to review the results.  Testing/Procedures: None  Follow-Up: At [facility], you and your health needs are our priority.  As part of our continuing mission to provide you with exceptional heart care, we have created designated Provider Care Teams.  These Care Teams include your primary Cardiologist (physician) and Advanced Practice Providers (APPs -  Physician Assistants and Nurse Practitioners) who all work together to provide you with the care you need, when you need it.  Your next appointment:   6 month(s)  The format for your next appointment:   In Person  Provider:   You may see [facility], MD or one of the following Advanced Practice Providers on your designated Care Team:    Kristeen Miss, PA-C  Vin [place], [place]  [place], NP   Other Instructions     Berton Bon  [facility] [phone]

## 2019-06-08 ENCOUNTER — Ambulatory Visit: Payer: BC Managed Care – PPO | Attending: Internal Medicine

## 2019-06-08 DIAGNOSIS — M9901 Segmental and somatic dysfunction of cervical region: Secondary | ICD-10-CM | POA: Diagnosis not present

## 2019-06-08 DIAGNOSIS — M5137 Other intervertebral disc degeneration, lumbosacral region: Secondary | ICD-10-CM | POA: Diagnosis not present

## 2019-06-08 DIAGNOSIS — Z20822 Contact with and (suspected) exposure to covid-19: Secondary | ICD-10-CM | POA: Diagnosis not present

## 2019-06-08 DIAGNOSIS — M25511 Pain in right shoulder: Secondary | ICD-10-CM | POA: Diagnosis not present

## 2019-06-08 DIAGNOSIS — M79601 Pain in right arm: Secondary | ICD-10-CM | POA: Diagnosis not present

## 2019-06-08 DIAGNOSIS — M9902 Segmental and somatic dysfunction of thoracic region: Secondary | ICD-10-CM | POA: Diagnosis not present

## 2019-06-08 DIAGNOSIS — M542 Cervicalgia: Secondary | ICD-10-CM | POA: Diagnosis not present

## 2019-06-09 ENCOUNTER — Other Ambulatory Visit: Payer: BC Managed Care – PPO

## 2019-06-09 LAB — NOVEL CORONAVIRUS, NAA: SARS-CoV-2, NAA: NOT DETECTED

## 2019-06-12 DIAGNOSIS — M9901 Segmental and somatic dysfunction of cervical region: Secondary | ICD-10-CM | POA: Diagnosis not present

## 2019-06-12 DIAGNOSIS — M542 Cervicalgia: Secondary | ICD-10-CM | POA: Diagnosis not present

## 2019-06-12 DIAGNOSIS — M9902 Segmental and somatic dysfunction of thoracic region: Secondary | ICD-10-CM | POA: Diagnosis not present

## 2019-06-12 DIAGNOSIS — M5137 Other intervertebral disc degeneration, lumbosacral region: Secondary | ICD-10-CM | POA: Diagnosis not present

## 2019-06-14 DIAGNOSIS — Z30431 Encounter for routine checking of intrauterine contraceptive device: Secondary | ICD-10-CM | POA: Diagnosis not present

## 2019-06-14 DIAGNOSIS — I10 Essential (primary) hypertension: Secondary | ICD-10-CM | POA: Diagnosis not present

## 2019-06-20 ENCOUNTER — Ambulatory Visit: Payer: BC Managed Care – PPO | Attending: Internal Medicine

## 2019-06-20 DIAGNOSIS — R109 Unspecified abdominal pain: Secondary | ICD-10-CM | POA: Diagnosis not present

## 2019-06-20 DIAGNOSIS — Z20822 Contact with and (suspected) exposure to covid-19: Secondary | ICD-10-CM

## 2019-06-21 ENCOUNTER — Ambulatory Visit (INDEPENDENT_AMBULATORY_CARE_PROVIDER_SITE_OTHER): Payer: BC Managed Care – PPO

## 2019-06-21 ENCOUNTER — Telehealth: Payer: Self-pay | Admitting: *Deleted

## 2019-06-21 ENCOUNTER — Other Ambulatory Visit: Payer: Self-pay | Admitting: Family Medicine

## 2019-06-21 ENCOUNTER — Other Ambulatory Visit: Payer: BC Managed Care – PPO

## 2019-06-21 ENCOUNTER — Other Ambulatory Visit: Payer: Self-pay

## 2019-06-21 DIAGNOSIS — R109 Unspecified abdominal pain: Secondary | ICD-10-CM

## 2019-06-21 LAB — NOVEL CORONAVIRUS, NAA: SARS-CoV-2, NAA: NOT DETECTED

## 2019-06-21 NOTE — Telephone Encounter (Signed)
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 Scheduled 1st Covid 19 vaccine for Friday 2/26@8 :45a at the [facility].

## 2019-06-22 ENCOUNTER — Other Ambulatory Visit: Payer: BC Managed Care – PPO

## 2019-06-22 ENCOUNTER — Ambulatory Visit: Payer: BC Managed Care – PPO | Attending: Internal Medicine

## 2019-06-22 DIAGNOSIS — Z20822 Contact with and (suspected) exposure to covid-19: Secondary | ICD-10-CM

## 2019-06-23 ENCOUNTER — Ambulatory Visit: Payer: BC Managed Care – PPO | Attending: Internal Medicine

## 2019-06-23 DIAGNOSIS — Z23 Encounter for immunization: Secondary | ICD-10-CM | POA: Insufficient documentation

## 2019-06-23 DIAGNOSIS — N898 Other specified noninflammatory disorders of vagina: Secondary | ICD-10-CM | POA: Diagnosis not present

## 2019-06-23 DIAGNOSIS — Z30431 Encounter for routine checking of intrauterine contraceptive device: Secondary | ICD-10-CM | POA: Diagnosis not present

## 2019-06-23 LAB — NOVEL CORONAVIRUS, NAA: SARS-CoV-2, NAA: NOT DETECTED

## 2019-06-23 NOTE — Progress Notes (Signed)
   Covid-19 Vaccination Clinic  Name:  Sarah Giles    MRN: [id number] DOB: [birth year]  06/23/2019  [name] was observed post Covid-19 immunization for 15 minutes without incidence. She was provided with Vaccine Information Sheet and instruction to access the V-Safe system.   [name] was instructed to call 911 with any severe reactions post vaccine: Marland Kitchen Difficulty breathing  . Swelling of your face and throat  . A fast heartbeat  . A bad rash all over your body  . Dizziness and weakness    Immunizations Administered    Name Date Dose VIS Date Route   Pfizer COVID-19 Vaccine 06/23/2019  8:53 AM 0.3 mL 04/07/2019 Intramuscular   Manufacturer: [employer], [employer]   Lot: [id number]   NDC: [address]-2

## 2019-06-30 ENCOUNTER — Other Ambulatory Visit: Payer: Self-pay | Admitting: Cardiovascular Disease

## 2019-07-03 DIAGNOSIS — F410 Panic disorder [episodic paroxysmal anxiety] without agoraphobia: Secondary | ICD-10-CM | POA: Diagnosis not present

## 2019-07-03 DIAGNOSIS — F438 Other reactions to severe stress: Secondary | ICD-10-CM | POA: Diagnosis not present

## 2019-07-03 DIAGNOSIS — F4521 Hypochondriasis: Secondary | ICD-10-CM | POA: Diagnosis not present

## 2019-07-07 DIAGNOSIS — Z0000 Encounter for general adult medical examination without abnormal findings: Secondary | ICD-10-CM | POA: Diagnosis not present

## 2019-07-07 DIAGNOSIS — R0789 Other chest pain: Secondary | ICD-10-CM | POA: Diagnosis not present

## 2019-07-10 DIAGNOSIS — F411 Generalized anxiety disorder: Secondary | ICD-10-CM | POA: Insufficient documentation

## 2019-07-10 DIAGNOSIS — R5383 Other fatigue: Secondary | ICD-10-CM | POA: Diagnosis not present

## 2019-07-10 DIAGNOSIS — I10 Essential (primary) hypertension: Secondary | ICD-10-CM | POA: Diagnosis not present

## 2019-07-12 ENCOUNTER — Ambulatory Visit: Payer: BC Managed Care – PPO | Attending: Internal Medicine

## 2019-07-12 DIAGNOSIS — Z30431 Encounter for routine checking of intrauterine contraceptive device: Secondary | ICD-10-CM | POA: Diagnosis not present

## 2019-07-12 DIAGNOSIS — N943 Premenstrual tension syndrome: Secondary | ICD-10-CM | POA: Diagnosis not present

## 2019-07-12 DIAGNOSIS — Z20822 Contact with and (suspected) exposure to covid-19: Secondary | ICD-10-CM | POA: Diagnosis not present

## 2019-07-13 LAB — NOVEL CORONAVIRUS, NAA: SARS-CoV-2, NAA: NOT DETECTED

## 2019-07-16 DIAGNOSIS — J309 Allergic rhinitis, unspecified: Secondary | ICD-10-CM | POA: Diagnosis not present

## 2019-07-16 DIAGNOSIS — R05 Cough: Secondary | ICD-10-CM | POA: Diagnosis not present

## 2019-07-18 ENCOUNTER — Ambulatory Visit: Payer: BC Managed Care – PPO | Attending: Internal Medicine

## 2019-07-18 DIAGNOSIS — Z23 Encounter for immunization: Secondary | ICD-10-CM

## 2019-07-18 NOTE — Progress Notes (Signed)
   Covid-19 Vaccination Clinic  Name:  Sarah Giles    MRN: [id number] DOB: 10/17/1990  07/18/2019  [name] was observed post Covid-19 immunization for 15 minutes without incident. She was provided with Vaccine Information Sheet and instruction to access the V-Safe system.   [name] was instructed to call 911 with any severe reactions post vaccine: Marland Kitchen Difficulty breathing  . Swelling of face and throat  . A fast heartbeat  . A bad rash all over body  . Dizziness and weakness   Immunizations Administered    Name Date Dose VIS Date Route   Pfizer COVID-19 Vaccine 07/18/2019 10:29 AM 0.3 mL 04/07/2019 Intramuscular   Manufacturer: [employer], [employer]   Lot: [id number]   NDC: [address]-7

## 2019-07-19 DIAGNOSIS — M79602 Pain in left arm: Secondary | ICD-10-CM | POA: Diagnosis not present

## 2019-07-19 DIAGNOSIS — M542 Cervicalgia: Secondary | ICD-10-CM | POA: Diagnosis not present

## 2019-07-21 DIAGNOSIS — M25512 Pain in left shoulder: Secondary | ICD-10-CM | POA: Insufficient documentation

## 2019-07-21 DIAGNOSIS — S161XXA Strain of muscle, fascia and tendon at neck level, initial encounter: Secondary | ICD-10-CM | POA: Diagnosis not present

## 2019-08-02 DIAGNOSIS — N951 Menopausal and female climacteric states: Secondary | ICD-10-CM | POA: Diagnosis not present

## 2019-08-02 DIAGNOSIS — R5383 Other fatigue: Secondary | ICD-10-CM | POA: Diagnosis not present

## 2019-08-02 DIAGNOSIS — R635 Abnormal weight gain: Secondary | ICD-10-CM | POA: Diagnosis not present

## 2019-08-02 DIAGNOSIS — E611 Iron deficiency: Secondary | ICD-10-CM | POA: Diagnosis not present

## 2019-08-07 ENCOUNTER — Ambulatory Visit: Payer: BC Managed Care – PPO | Admitting: Cardiovascular Disease

## 2019-08-07 ENCOUNTER — Encounter: Payer: Self-pay | Admitting: Cardiovascular Disease

## 2019-08-07 ENCOUNTER — Other Ambulatory Visit: Payer: Self-pay

## 2019-08-07 VITALS — BP 134/82 | HR 87 | Ht 67.0 in | Wt 266.1 lb

## 2019-08-07 DIAGNOSIS — Z1339 Encounter for screening examination for other mental health and behavioral disorders: Secondary | ICD-10-CM | POA: Diagnosis not present

## 2019-08-07 DIAGNOSIS — Z1331 Encounter for screening for depression: Secondary | ICD-10-CM | POA: Diagnosis not present

## 2019-08-07 DIAGNOSIS — I10 Essential (primary) hypertension: Secondary | ICD-10-CM | POA: Diagnosis not present

## 2019-08-07 DIAGNOSIS — G479 Sleep disorder, unspecified: Secondary | ICD-10-CM | POA: Diagnosis not present

## 2019-08-07 DIAGNOSIS — N951 Menopausal and female climacteric states: Secondary | ICD-10-CM | POA: Diagnosis not present

## 2019-08-07 DIAGNOSIS — R4586 Emotional lability: Secondary | ICD-10-CM | POA: Diagnosis not present

## 2019-08-07 DIAGNOSIS — R5383 Other fatigue: Secondary | ICD-10-CM | POA: Diagnosis not present

## 2019-08-07 MED ORDER — POTASSIUM CHLORIDE ER 10 MEQ PO TBCR: 10.0000 meq | EXTENDED_RELEASE_TABLET | Freq: Every day | ORAL | 3 refills | Status: DC

## 2019-08-07 NOTE — Patient Instructions (Signed)
Medication Instructions:  Your physician has recommended you make the following change in your medication:  START Potassium chloride (Kdur) 10 mEq once daily *If you need a refill on your cardiac medications before your next appointment, please call your pharmacy*   Lab Work: Your physician recommends that you return for lab work in: 3 weeks for basic metabolic panel  If you have labs (blood work) drawn today and your tests are completely normal, you will receive your results only by: Marland Kitchen MyChart Message (if you have MyChart) OR . A paper copy in the mail If you have any lab test that is abnormal or we need to change your treatment, we will call you to review the results.   Testing/Procedures: None Ordered   Follow-Up: At [facility], you and your health needs are our priority.  As part of our continuing mission to provide you with exceptional heart care, we have created designated Provider Care Teams.  These Care Teams include your primary Cardiologist (physician) and Advanced Practice Providers (APPs -  Physician Assistants and Nurse Practitioners) who all work together to provide you with the care you need, when you need it.  We recommend signing up for the patient portal called "MyChart".  Sign up information is provided on this After Visit Summary.  MyChart is used to connect with patients for Virtual Visits (Telemedicine).  Patients are able to view lab/test results, encounter notes, upcoming appointments, etc.  Non-urgent messages can be sent to your provider as well.   To learn more about what you can do with MyChart, go to [email or web address].    Your next appointment:   1 year(s)  The format for your next appointment:   In Person  Provider:   You may see Kristeen Miss, MD or one of the following Advanced Practice Providers on your designated Care Team:    Tereso Newcomer, PA-C  Vin [place], [place]  Berton Bon, [place]

## 2019-08-07 NOTE — Progress Notes (Signed)
Cardiology Office Note:    Date:  08/07/2019   ID:  Sarah Giles, DOB 07/25/1990, MRN [id number]  PCP:  Bryon Lions, PA-C  Cardiologist:  Kristeen Miss, MD  Electrophysiologist:  None   Referring MD: Soundra Pilon, FNP   Chief Complaint  Patient presents with  . Hypertension  . Palpitations    November 08, 2018    Sarah Giles is a 29 y.o. female with a hx of dyspnea , HTN  She is [redacted] weeks pregnant.   We are asked to see her today by Dr. brake for further evaluation of these palpitations and dizziness.  Changed from HCTZ to Labetalol with pregnancy    3 weeks ago, she woke up very short of breath. Couldn't breath, lightheadedness. Went to the ER  Work up was normal .  HR was very fast  Bought a pulse oxymeter  Was very nervous,   Occurs with sitting or lying down. 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  Delivered baby boy on Nov. 10.  Has lost some weight  bp has been better recently  Has lots of anxiety symptoms .    A friend of hers died of CHF recently .   She developed chest heaviness and tachycardia  She is now on HCTZ 25 mg  A day .  Is off her amlodipine and labetalol. BP is typically well controlled.   August 07, 2019: Sarah Giles is seen today for follow-up visit.  Her blood pressure has been well controlled. Has been doing well  Has muscle spasms in her left chest  Baby is 5 months old   Is working with [facility] program  Past Medical History:  Diagnosis Date  . Allergy   . Gestational hypertension 04/27/2014  . Hypertension   . Medical history non-contributory   . Morbid obesity (HCC) 04/27/2014  . NSVD  (normal spontaneous vaginal delivery) 04/28/2014    Past Surgical History:  Procedure Laterality Date  . BREAST SURGERY     2008    Current Medications: Current Meds  Medication Sig  . hydrochlorothiazide (HYDRODIURIL) 25 MG tablet Take 1 tablet by mouth daily.  Marland Kitchen loratadine (CLARITIN) 10 MG tablet Take 10 mg by mouth daily.  . propranolol (INDERAL) 10 MG tablet Take 1 tablet (10 mg total) by mouth 4 (four) times daily as needed.  . [DISCONTINUED] hydrOXYzine (ATARAX/VISTARIL) 10 MG tablet Take 10 mg by mouth daily as needed for anxiety.     Allergies:   Hydroxyzine and Contrast media [iodinated diagnostic agents]   Social History   Socioeconomic History  . Marital status: Unknown    Spouse name: Not on file  . Number of children: Not on file  . Years of education: Not on file  . Highest education level: Not on file  Occupational History  . Not on file  Tobacco Use  . Smoking status: Never Smoker  . Smokeless tobacco: Never Used  Substance and Sexual Activity  . Alcohol use: Not Currently    Alcohol/week: 3.0 standard drinks  Types: 3 Standard drinks or equivalent per week  . Drug use: Never  . Sexual activity: Yes    Birth control/protection: None  Other Topics Concern  . Not on file  Social History Narrative  . Not on file   Social Determinants of Health   Financial Resource Strain:   . Difficulty of Paying Living Expenses:   Food Insecurity:   . Worried About Charity fundraiser in the Last Year:   . Arboriculturist in the Last Year:   Transportation Needs:   . Film/video editor (Medical):   Marland Kitchen Lack of Transportation (Non-Medical):   Physical Activity:   . Days of Exercise per Week:   . Minutes of Exercise per Session:   Stress:   . Feeling of Stress :   Social Connections:   . Frequency of Communication with Friends and Family:   . Frequency of Social Gatherings with Friends and Family:   . Attends Religious Services:   . Active Member of Clubs  or Organizations:   . Attends Archivist Meetings:   Marland Kitchen Marital Status:      Family History: The patient's family history includes Diabetes in her father and paternal grandfather; Hypertension in her maternal grandmother and mother.  ROS:   Please see the history of present illness.     All other systems reviewed and are negative.  EKGs/Labs/Other Studies Reviewed:    The following studies were reviewed today:   EKG:   Recent Labs: 03/12/2019: ALT 52 05/05/2019: BUN 14; Creatinine, Ser 0.93; Hemoglobin 13.2; Platelets 324; Potassium 4.2; Sodium 140  Recent Lipid Panel No results found for: CHOL, TRIG, HDL, CHOLHDL, VLDL, LDLCALC, LDLDIRECT  Physical Exam:     Physical Exam: Blood pressure 134/82, pulse 87, height 5\' 7"  (1.702 m), weight 266 lb 1.9 oz (120.7 kg), SpO2 99 %, unknown if currently breastfeeding.  GEN:    Young female,  modertely obese.   HEENT: Normal NECK: No JVD; No carotid bruits LYMPHATICS: No lymphadenopathy CARDIAC: RRR , no murmurs, rubs, gallops RESPIRATORY:  Clear to auscultation without rales, wheezing or rhonchi  ABDOMEN: Soft, non-tender, non-distended MUSCULOSKELETAL:  No edema; No deformity  SKIN: Warm and dry NEUROLOGIC:  Alert and oriented x 3   ASSESSMENT:    1. Essential hypertension    PLAN:    In order of problems listed above:  1. Palpitations:   Seem to be better      2.  Anxiety:      3.  Hypertension: Blood pressures well controlled.  Continue HCTZ.  She is having some muscle spasms on occasion.  We will add potassium chloride 10 mEq a day.  Her last potassium level at her [facility] primary medical doctor was 3.6 and I think she will have fewer muscle spasms with a higher potassium.  4. Obesity:  Is seeing doctors at [facility] weight loss center   Plan on seeing her again in 1 year.  I will see her again in  6 months     Medication Adjustments/Labs and Tests Ordered: Current medicines are reviewed at  length with the patient today.  Concerns regarding medicines are outlined above.  Orders Placed This Encounter  Procedures  . Basic Metabolic Panel (BMET)   Meds ordered this encounter  Medications  . potassium chloride (KLOR-CON) 10 MEQ tablet    Sig: Take 1 tablet (10 mEq total) by mouth daily.    Dispense:  90 tablet    Refill:  3    Patient Instructions  Medication Instructions:  Your physician has recommended you make the following change in your medication:  START Potassium chloride (Kdur) 10 mEq once daily *If you need a refill on your cardiac medications before your next appointment, please call your pharmacy*   Lab Work: Your physician recommends that you return for lab work in: 3 weeks for basic metabolic panel  If you have labs (blood work) drawn today and your tests are completely normal, you will receive your results only by: Marland Kitchen MyChart Message (if you have MyChart) OR . A paper copy in the mail If you have any lab test that is abnormal or we need to change your treatment, we will call you to review the results.   Testing/Procedures: None Ordered   Follow-Up: At [facility], you and your health needs are our priority.  As part of our continuing mission to provide you with exceptional heart care, we have created designated Provider Care Teams.  These Care Teams include your primary Cardiologist (physician) and Advanced Practice Providers (APPs -  Physician Assistants and Nurse Practitioners) who all work together to provide you with the care you need, when you need it.  We recommend signing up for the patient portal called "MyChart".  Sign up information is provided on this After Visit Summary.  MyChart is used to connect with patients for Virtual Visits (Telemedicine).  Patients are able to view lab/test results, encounter notes, upcoming appointments, etc.  Non-urgent messages can be sent to your provider as well.   To learn more about what you can do with  MyChart, go to [email or web address].    Your next appointment:   1 year(s)  The format for your next appointment:   In Person  Provider:   You may see Kristeen Miss, MD or one of the following Advanced Practice Providers on your designated Care Team:    Tereso Newcomer, PA-C  [place]in [place], [place]  [place], [place]        Signed, Kristeen Miss, MD  08/07/2019 1:26 PM    [facility]

## 2019-08-09 ENCOUNTER — Ambulatory Visit: Payer: BC Managed Care – PPO | Attending: Internal Medicine

## 2019-08-09 DIAGNOSIS — Z20822 Contact with and (suspected) exposure to covid-19: Secondary | ICD-10-CM

## 2019-08-10 DIAGNOSIS — F410 Panic disorder [episodic paroxysmal anxiety] without agoraphobia: Secondary | ICD-10-CM | POA: Diagnosis not present

## 2019-08-10 DIAGNOSIS — F438 Other reactions to severe stress: Secondary | ICD-10-CM | POA: Diagnosis not present

## 2019-08-10 DIAGNOSIS — F4521 Hypochondriasis: Secondary | ICD-10-CM | POA: Diagnosis not present

## 2019-08-10 LAB — SARS-COV-2, NAA 2 DAY TAT

## 2019-08-10 LAB — NOVEL CORONAVIRUS, NAA: SARS-CoV-2, NAA: NOT DETECTED

## 2019-08-11 DIAGNOSIS — F4521 Hypochondriasis: Secondary | ICD-10-CM | POA: Diagnosis not present

## 2019-08-11 DIAGNOSIS — F438 Other reactions to severe stress: Secondary | ICD-10-CM | POA: Diagnosis not present

## 2019-08-11 DIAGNOSIS — F410 Panic disorder [episodic paroxysmal anxiety] without agoraphobia: Secondary | ICD-10-CM | POA: Diagnosis not present

## 2019-08-22 DIAGNOSIS — E611 Iron deficiency: Secondary | ICD-10-CM | POA: Diagnosis not present

## 2019-08-22 DIAGNOSIS — Z6841 Body Mass Index (BMI) 40.0 and over, adult: Secondary | ICD-10-CM | POA: Diagnosis not present

## 2019-08-23 ENCOUNTER — Ambulatory Visit: Payer: BC Managed Care – PPO | Attending: Internal Medicine

## 2019-08-23 DIAGNOSIS — Z20822 Contact with and (suspected) exposure to covid-19: Secondary | ICD-10-CM | POA: Diagnosis not present

## 2019-08-24 LAB — NOVEL CORONAVIRUS, NAA: SARS-CoV-2, NAA: NOT DETECTED

## 2019-08-24 LAB — SARS-COV-2, NAA 2 DAY TAT

## 2019-08-25 DIAGNOSIS — H8309 Labyrinthitis, unspecified ear: Secondary | ICD-10-CM | POA: Diagnosis not present

## 2019-08-28 ENCOUNTER — Other Ambulatory Visit: Payer: BC Managed Care – PPO

## 2019-08-28 ENCOUNTER — Ambulatory Visit: Payer: BC Managed Care – PPO

## 2019-08-29 ENCOUNTER — Other Ambulatory Visit: Payer: BC Managed Care – PPO

## 2019-08-29 ENCOUNTER — Other Ambulatory Visit: Payer: Self-pay

## 2019-08-29 DIAGNOSIS — I10 Essential (primary) hypertension: Secondary | ICD-10-CM

## 2019-08-29 LAB — BASIC METABOLIC PANEL
BUN/Creatinine Ratio: 19 (ref 9–23)
BUN: 15 mg/dL (ref 6–20)
CO2: 25 mmol/L (ref 20–29)
Calcium: 9.5 mg/dL (ref 8.7–10.2)
Chloride: 103 mmol/L (ref 96–106)
Creatinine, Ser: 0.77 mg/dL (ref 0.57–1.00)
GFR calc Af Amer: 120 mL/min/{1.73_m2} (ref >59)
GFR calc non Af Amer: 104 mL/min/{1.73_m2} (ref >59)
Glucose: 80 mg/dL (ref 65–99)
Potassium: 3.7 mmol/L (ref 3.5–5.2)
Sodium: 139 mmol/L (ref 134–144)

## 2019-08-31 DIAGNOSIS — M5412 Radiculopathy, cervical region: Secondary | ICD-10-CM | POA: Diagnosis not present

## 2019-09-04 DIAGNOSIS — R002 Palpitations: Secondary | ICD-10-CM | POA: Diagnosis not present

## 2019-09-04 DIAGNOSIS — F411 Generalized anxiety disorder: Secondary | ICD-10-CM | POA: Diagnosis not present

## 2019-09-04 DIAGNOSIS — I10 Essential (primary) hypertension: Secondary | ICD-10-CM | POA: Diagnosis not present

## 2019-09-06 DIAGNOSIS — M25512 Pain in left shoulder: Secondary | ICD-10-CM | POA: Diagnosis not present

## 2019-09-11 DIAGNOSIS — Z01419 Encounter for gynecological examination (general) (routine) without abnormal findings: Secondary | ICD-10-CM | POA: Diagnosis not present

## 2019-09-11 DIAGNOSIS — N939 Abnormal uterine and vaginal bleeding, unspecified: Secondary | ICD-10-CM | POA: Diagnosis not present

## 2019-09-11 DIAGNOSIS — Z113 Encounter for screening for infections with a predominantly sexual mode of transmission: Secondary | ICD-10-CM | POA: Diagnosis not present

## 2019-09-11 DIAGNOSIS — M5412 Radiculopathy, cervical region: Secondary | ICD-10-CM | POA: Diagnosis not present

## 2019-09-11 DIAGNOSIS — R300 Dysuria: Secondary | ICD-10-CM | POA: Diagnosis not present

## 2019-09-11 DIAGNOSIS — E559 Vitamin D deficiency, unspecified: Secondary | ICD-10-CM | POA: Diagnosis not present

## 2019-09-11 DIAGNOSIS — E876 Hypokalemia: Secondary | ICD-10-CM | POA: Diagnosis not present

## 2019-09-11 DIAGNOSIS — Z124 Encounter for screening for malignant neoplasm of cervix: Secondary | ICD-10-CM | POA: Diagnosis not present

## 2019-09-12 ENCOUNTER — Other Ambulatory Visit: Payer: Self-pay | Admitting: Obstetrics and Gynecology

## 2019-09-12 DIAGNOSIS — N939 Abnormal uterine and vaginal bleeding, unspecified: Secondary | ICD-10-CM

## 2019-09-20 DIAGNOSIS — M5412 Radiculopathy, cervical region: Secondary | ICD-10-CM | POA: Diagnosis not present

## 2019-09-21 ENCOUNTER — Ambulatory Visit
Admission: RE | Admit: 2019-09-21 | Discharge: 2019-09-21 | Disposition: A | Discharge status: Home / Self Care | Payer: BC Managed Care – PPO | Source: Ambulatory Visit | Attending: Obstetrics and Gynecology | Admitting: Obstetrics and Gynecology

## 2019-09-21 DIAGNOSIS — N939 Abnormal uterine and vaginal bleeding, unspecified: Secondary | ICD-10-CM

## 2019-09-22 DIAGNOSIS — R002 Palpitations: Secondary | ICD-10-CM | POA: Diagnosis not present

## 2019-09-22 DIAGNOSIS — I10 Essential (primary) hypertension: Secondary | ICD-10-CM | POA: Diagnosis not present

## 2019-09-26 DIAGNOSIS — R9389 Abnormal findings on diagnostic imaging of other specified body structures: Secondary | ICD-10-CM | POA: Diagnosis not present

## 2019-09-26 DIAGNOSIS — Z30431 Encounter for routine checking of intrauterine contraceptive device: Secondary | ICD-10-CM | POA: Diagnosis not present

## 2019-10-02 ENCOUNTER — Telehealth: Payer: Self-pay | Admitting: Cardiovascular Disease

## 2019-10-02 DIAGNOSIS — R0789 Other chest pain: Secondary | ICD-10-CM | POA: Diagnosis not present

## 2019-10-02 DIAGNOSIS — I10 Essential (primary) hypertension: Secondary | ICD-10-CM | POA: Diagnosis not present

## 2019-10-02 DIAGNOSIS — R42 Dizziness and giddiness: Secondary | ICD-10-CM | POA: Diagnosis not present

## 2019-10-02 DIAGNOSIS — R002 Palpitations: Secondary | ICD-10-CM | POA: Diagnosis not present

## 2019-10-02 NOTE — Telephone Encounter (Signed)
Attempted to call patient. Unable to LVM.

## 2019-10-02 NOTE — Telephone Encounter (Signed)
Spoke with the patient who states that she has been having some feelings of skipped heart beats. She states that she has also been having an achy feeling in the center of her chest that lasts for short periods and goes away. She feels like it could be muscular pain. She states that it is not associate with the palpitations. She denies any SOB but states that she dose get dizzy sometimes. States that her BP has been running good but sometimes her watch shows her HR to be in the high 50s. She can not relate her symptoms to any specific triggers. Advised patient to continue to monitor symptoms and I would forward to Dr. Elease Hashimoto for any recommendations/'further testing.

## 2019-10-02 NOTE — Telephone Encounter (Signed)
Attempted to call patient back. Unable to leave VM.

## 2019-10-02 NOTE — Telephone Encounter (Signed)
Spoke with the patient and she will increase Kdur ot 20 meq daily. She will continue to monitor her symptoms and call us back in a few days if there is no improvement.

## 2019-10-02 NOTE — Telephone Encounter (Signed)
Follow up ° ° °Patient is returning call. Please call. °

## 2019-10-02 NOTE — Telephone Encounter (Signed)
Sarah Giles is calling due to recently speaking with her PCP and being advising her to reach out to Dr. Harvie Bridge office, to see if he feels another holter monitor is necessary. She states she is still having episodes, but the EKG taken by her PCP came back fine. She would also like to know if Dr. Elease Hashimoto feels she would have to wear it for another 30 days if another monitor is needed due to having a reaction to the adhesive last time. Please advise.

## 2019-10-02 NOTE — Telephone Encounter (Signed)
Her symptoms sound like PVCs.  We added potassium supplement and while the potassium level was in the normal range, she may have fewer palpitations if we can get her potassium around 4.  Lets increase her kdur to 20 meq a day  If her palpitations do not improve over the next several days,  we will place an event monitor

## 2019-10-10 ENCOUNTER — Other Ambulatory Visit: Payer: Self-pay | Admitting: Obstetrics and Gynecology

## 2019-10-10 DIAGNOSIS — N840 Polyp of corpus uteri: Secondary | ICD-10-CM | POA: Diagnosis not present

## 2019-10-12 DIAGNOSIS — Z3040 Encounter for surveillance of contraceptives, unspecified: Secondary | ICD-10-CM | POA: Diagnosis not present

## 2019-10-12 DIAGNOSIS — R9389 Abnormal findings on diagnostic imaging of other specified body structures: Secondary | ICD-10-CM | POA: Diagnosis not present

## 2019-10-17 DIAGNOSIS — Z30432 Encounter for removal of intrauterine contraceptive device: Secondary | ICD-10-CM | POA: Diagnosis not present

## 2019-10-17 DIAGNOSIS — N840 Polyp of corpus uteri: Secondary | ICD-10-CM | POA: Diagnosis not present

## 2019-10-27 ENCOUNTER — Other Ambulatory Visit: Payer: Self-pay | Admitting: Obstetrics and Gynecology

## 2019-10-27 DIAGNOSIS — N840 Polyp of corpus uteri: Secondary | ICD-10-CM | POA: Diagnosis not present

## 2019-10-27 DIAGNOSIS — N939 Abnormal uterine and vaginal bleeding, unspecified: Secondary | ICD-10-CM | POA: Diagnosis not present

## 2019-10-27 DIAGNOSIS — N920 Excessive and frequent menstruation with regular cycle: Secondary | ICD-10-CM | POA: Diagnosis not present

## 2019-11-08 DIAGNOSIS — I10 Essential (primary) hypertension: Secondary | ICD-10-CM | POA: Diagnosis not present

## 2019-11-08 DIAGNOSIS — R10815 Periumbilic abdominal tenderness: Secondary | ICD-10-CM | POA: Diagnosis not present

## 2019-11-20 ENCOUNTER — Ambulatory Visit: Payer: BC Managed Care – PPO | Attending: Internal Medicine

## 2019-11-20 DIAGNOSIS — Z20822 Contact with and (suspected) exposure to covid-19: Secondary | ICD-10-CM

## 2019-11-21 LAB — SARS-COV-2, NAA 2 DAY TAT

## 2019-11-21 LAB — NOVEL CORONAVIRUS, NAA: SARS-CoV-2, NAA: NOT DETECTED

## 2019-12-05 DIAGNOSIS — I10 Essential (primary) hypertension: Secondary | ICD-10-CM | POA: Diagnosis not present

## 2019-12-05 DIAGNOSIS — R002 Palpitations: Secondary | ICD-10-CM | POA: Diagnosis not present

## 2019-12-14 DIAGNOSIS — I071 Rheumatic tricuspid insufficiency: Secondary | ICD-10-CM | POA: Diagnosis not present

## 2019-12-27 ENCOUNTER — Other Ambulatory Visit: Payer: Self-pay | Admitting: Critical Care Medicine

## 2019-12-27 ENCOUNTER — Other Ambulatory Visit: Payer: BC Managed Care – PPO

## 2019-12-27 DIAGNOSIS — Z20822 Contact with and (suspected) exposure to covid-19: Secondary | ICD-10-CM

## 2019-12-29 DIAGNOSIS — J069 Acute upper respiratory infection, unspecified: Secondary | ICD-10-CM | POA: Diagnosis not present

## 2019-12-29 LAB — NOVEL CORONAVIRUS, NAA: SARS-CoV-2, NAA: NOT DETECTED

## 2019-12-30 DIAGNOSIS — Z20828 Contact with and (suspected) exposure to other viral communicable diseases: Secondary | ICD-10-CM | POA: Diagnosis not present

## 2020-01-03 ENCOUNTER — Other Ambulatory Visit: Payer: BC Managed Care – PPO

## 2020-01-09 DIAGNOSIS — Z8616 Personal history of COVID-19: Secondary | ICD-10-CM | POA: Diagnosis not present

## 2020-01-18 DIAGNOSIS — I10 Essential (primary) hypertension: Secondary | ICD-10-CM | POA: Diagnosis not present

## 2020-01-30 DIAGNOSIS — R253 Fasciculation: Secondary | ICD-10-CM | POA: Diagnosis not present

## 2020-01-30 DIAGNOSIS — I10 Essential (primary) hypertension: Secondary | ICD-10-CM | POA: Diagnosis not present

## 2020-02-08 DIAGNOSIS — I10 Essential (primary) hypertension: Secondary | ICD-10-CM | POA: Diagnosis not present

## 2020-02-14 DIAGNOSIS — N849 Polyp of female genital tract, unspecified: Secondary | ICD-10-CM | POA: Diagnosis not present

## 2020-02-14 DIAGNOSIS — N912 Amenorrhea, unspecified: Secondary | ICD-10-CM | POA: Diagnosis not present

## 2020-02-16 DIAGNOSIS — N912 Amenorrhea, unspecified: Secondary | ICD-10-CM | POA: Diagnosis not present

## 2020-02-20 DIAGNOSIS — O99210 Obesity complicating pregnancy, unspecified trimester: Secondary | ICD-10-CM | POA: Insufficient documentation

## 2020-02-26 DIAGNOSIS — I10 Essential (primary) hypertension: Secondary | ICD-10-CM | POA: Diagnosis not present

## 2020-02-26 DIAGNOSIS — R253 Fasciculation: Secondary | ICD-10-CM | POA: Diagnosis not present

## 2020-02-26 DIAGNOSIS — R200 Anesthesia of skin: Secondary | ICD-10-CM | POA: Diagnosis not present

## 2020-03-11 DIAGNOSIS — Z362 Encounter for other antenatal screening follow-up: Secondary | ICD-10-CM | POA: Diagnosis not present

## 2020-03-11 LAB — OB RESULTS CONSOLE GC/CHLAMYDIA
Chlamydia: NEGATIVE
Gonorrhea: NEGATIVE

## 2020-03-11 LAB — OB RESULTS CONSOLE ABO/RH: RH Type: POSITIVE

## 2020-03-11 LAB — OB RESULTS CONSOLE ANTIBODY SCREEN: Antibody Screen: NEGATIVE

## 2020-03-11 LAB — OB RESULTS CONSOLE HEPATITIS B SURFACE ANTIGEN: Hepatitis B Surface Ag: NEGATIVE

## 2020-03-11 LAB — OB RESULTS CONSOLE RPR: RPR: NONREACTIVE

## 2020-03-11 LAB — OB RESULTS CONSOLE RUBELLA ANTIBODY, IGM: Rubella: IMMUNE

## 2020-03-11 LAB — OB RESULTS CONSOLE HIV ANTIBODY (ROUTINE TESTING): HIV: NONREACTIVE

## 2020-03-14 DIAGNOSIS — I10 Essential (primary) hypertension: Secondary | ICD-10-CM | POA: Diagnosis not present

## 2020-03-14 DIAGNOSIS — Z3491 Encounter for supervision of normal pregnancy, unspecified, first trimester: Secondary | ICD-10-CM | POA: Diagnosis not present

## 2020-03-14 DIAGNOSIS — R002 Palpitations: Secondary | ICD-10-CM | POA: Diagnosis not present

## 2020-03-25 DIAGNOSIS — Z3482 Encounter for supervision of other normal pregnancy, second trimester: Secondary | ICD-10-CM | POA: Diagnosis not present

## 2020-04-08 DIAGNOSIS — M9907 Segmental and somatic dysfunction of upper extremity: Secondary | ICD-10-CM | POA: Diagnosis not present

## 2020-04-08 DIAGNOSIS — M9903 Segmental and somatic dysfunction of lumbar region: Secondary | ICD-10-CM | POA: Diagnosis not present

## 2020-04-08 DIAGNOSIS — M9901 Segmental and somatic dysfunction of cervical region: Secondary | ICD-10-CM | POA: Diagnosis not present

## 2020-04-08 DIAGNOSIS — M5412 Radiculopathy, cervical region: Secondary | ICD-10-CM | POA: Diagnosis not present

## 2020-04-08 DIAGNOSIS — M9902 Segmental and somatic dysfunction of thoracic region: Secondary | ICD-10-CM | POA: Diagnosis not present

## 2020-04-08 DIAGNOSIS — M26602 Left temporomandibular joint disorder, unspecified: Secondary | ICD-10-CM | POA: Diagnosis not present

## 2020-04-09 DIAGNOSIS — E559 Vitamin D deficiency, unspecified: Secondary | ICD-10-CM | POA: Diagnosis not present

## 2020-04-09 DIAGNOSIS — O139 Gestational [pregnancy-induced] hypertension without significant proteinuria, unspecified trimester: Secondary | ICD-10-CM | POA: Diagnosis not present

## 2020-04-12 DIAGNOSIS — M9901 Segmental and somatic dysfunction of cervical region: Secondary | ICD-10-CM | POA: Diagnosis not present

## 2020-04-12 DIAGNOSIS — M9902 Segmental and somatic dysfunction of thoracic region: Secondary | ICD-10-CM | POA: Diagnosis not present

## 2020-04-12 DIAGNOSIS — M26602 Left temporomandibular joint disorder, unspecified: Secondary | ICD-10-CM | POA: Diagnosis not present

## 2020-04-12 DIAGNOSIS — M9903 Segmental and somatic dysfunction of lumbar region: Secondary | ICD-10-CM | POA: Diagnosis not present

## 2020-04-12 DIAGNOSIS — M9907 Segmental and somatic dysfunction of upper extremity: Secondary | ICD-10-CM | POA: Diagnosis not present

## 2020-04-15 DIAGNOSIS — M9903 Segmental and somatic dysfunction of lumbar region: Secondary | ICD-10-CM | POA: Diagnosis not present

## 2020-04-15 DIAGNOSIS — M9907 Segmental and somatic dysfunction of upper extremity: Secondary | ICD-10-CM | POA: Diagnosis not present

## 2020-04-15 DIAGNOSIS — M26602 Left temporomandibular joint disorder, unspecified: Secondary | ICD-10-CM | POA: Diagnosis not present

## 2020-04-15 DIAGNOSIS — M9901 Segmental and somatic dysfunction of cervical region: Secondary | ICD-10-CM | POA: Diagnosis not present

## 2020-04-15 DIAGNOSIS — M9902 Segmental and somatic dysfunction of thoracic region: Secondary | ICD-10-CM | POA: Diagnosis not present

## 2020-04-18 DIAGNOSIS — M9907 Segmental and somatic dysfunction of upper extremity: Secondary | ICD-10-CM | POA: Diagnosis not present

## 2020-04-18 DIAGNOSIS — M9901 Segmental and somatic dysfunction of cervical region: Secondary | ICD-10-CM | POA: Diagnosis not present

## 2020-04-18 DIAGNOSIS — M9902 Segmental and somatic dysfunction of thoracic region: Secondary | ICD-10-CM | POA: Diagnosis not present

## 2020-04-18 DIAGNOSIS — M26602 Left temporomandibular joint disorder, unspecified: Secondary | ICD-10-CM | POA: Diagnosis not present

## 2020-04-18 DIAGNOSIS — M9903 Segmental and somatic dysfunction of lumbar region: Secondary | ICD-10-CM | POA: Diagnosis not present

## 2020-04-27 NOTE — L&D Delivery Note (Signed)
Delivery Note Labor onset:   10/01/20 Labor Onset Time: 1836 Complete dilation at 5:55 AM  Onset of pushing at 0610 FHR second stage Cat 1 Analgesia/Anesthesia intrapartum: Epidural  Guided pushing with maternal urge. Delivery of a viable female at [phone]. Fetal head delivered in direct OA position.  Nuchal cord: x1, loose.  Infant placed on maternal abd, dried, and tactile stim.  Cord double clamped after 2 min and cut by Brett Canales, father.  Brett Canales present for birth.  Cord blood sample collected: Yes Arterial cord blood sample collected: No  Placenta delivered Tomasa Blase, intact, with 3 VC.  Placenta to L&D. Uterine tone firm, bleeding scant  No laceration identified.  Anesthesia: Epidural Repair: None QBL/EBL (mL): 50 Complications: None APGAR: APGAR (1 MIN): 8   APGAR (5 MINS): 9   APGAR (10 MINS):   Mom to postpartum.  Baby to Couplet care / Skin to Skin.  Roma Schanz MSN, CNM 10/02/2020, 7:26 AM

## 2020-09-12 ENCOUNTER — Encounter: Payer: Self-pay | Admitting: Physician Assistant

## 2020-09-18 ENCOUNTER — Other Ambulatory Visit: Payer: Self-pay | Admitting: Obstetrics & Gynecology

## 2020-09-25 ENCOUNTER — Telehealth (HOSPITAL_COMMUNITY): Payer: Self-pay | Admitting: *Deleted

## 2020-09-25 ENCOUNTER — Encounter (HOSPITAL_COMMUNITY): Payer: Self-pay | Admitting: *Deleted

## 2020-09-25 NOTE — Telephone Encounter (Signed)
Preadmission screen  

## 2020-09-30 ENCOUNTER — Other Ambulatory Visit (HOSPITAL_COMMUNITY)
Admission: RE | Admit: 2020-09-30 | Discharge: 2020-09-30 | Disposition: A | Discharge status: Home / Self Care | Payer: BC Managed Care – PPO | Source: Ambulatory Visit | Attending: Obstetrics & Gynecology | Admitting: Obstetrics & Gynecology

## 2020-09-30 DIAGNOSIS — Z01812 Encounter for preprocedural laboratory examination: Secondary | ICD-10-CM | POA: Insufficient documentation

## 2020-09-30 DIAGNOSIS — Z20822 Contact with and (suspected) exposure to covid-19: Secondary | ICD-10-CM | POA: Insufficient documentation

## 2020-09-30 DIAGNOSIS — Z3490 Encounter for supervision of normal pregnancy, unspecified, unspecified trimester: Secondary | ICD-10-CM | POA: Diagnosis present

## 2020-09-30 LAB — SARS CORONAVIRUS 2 (TAT 6-24 HRS): SARS Coronavirus 2: NEGATIVE

## 2020-09-30 NOTE — H&P (Signed)
Sarah Giles is a 30 y.o. female, G3P2002, IUP at 38.1 weeks, presenting for IOL for CHTN (On Labetalol 200mg  PO BID from early pregnancy. Check PIH labs/PCR at NOB--WNL, follow growth, delivery by 38-39 weeks.), SVT (6 sec episode on event monitor during pregnancy, cardiology recommends MRI, declined during pregnancy. Normal EKG 04/03/19 in [facility]U, f/u by [facility] cardiology), Obesity (BMI 43) , anxiety, h/o breast reduction (2007--did make some milk after last delivery.). Covid + 05/04/2020. Pt endorse + Fm. Denies vaginal leakage. Denies vaginal bleeding. Denies feeling cxt's.   Patient Active Problem List   Diagnosis Date Noted  . Encounter for planned induction of labor 09/30/2020  . Chronic hypertension with exacerbation during pregnancy in third trimester 03/06/2019  . Palpitations 11/08/2018  . Morbid obesity (HCC) 04/27/2014     Active Ambulatory Problems    Diagnosis Date Noted  . Morbid obesity (HCC) 04/27/2014  . Palpitations 11/08/2018  . Chronic hypertension with exacerbation during pregnancy in third trimester 03/06/2019   Resolved Ambulatory Problems    Diagnosis Date Noted  . Gestational hypertension 04/27/2014  . NSVD (normal spontaneous vaginal delivery) 04/28/2014  . Dizziness 11/08/2018  . HTN (hypertension) 11/08/2018  . SVD (11/10) 03/07/2019   Past Medical History:  Diagnosis Date  . Allergy   . Hypertension   . Medical history non-contributory       No medications prior to admission.    Past Medical History:  Diagnosis Date  . Allergy   . Gestational hypertension 04/27/2014  . Hypertension   . Medical history non-contributory   . Morbid obesity (HCC) 04/27/2014  . NSVD (normal spontaneous vaginal delivery) 04/28/2014     No current facility-administered medications on file prior to encounter.   Current Outpatient Medications on File Prior to Encounter  Medication Sig Dispense Refill  . hydrochlorothiazide (HYDRODIURIL) 25 MG tablet Take 1 tablet by mouth  daily.    06/27/2014 loratadine (CLARITIN) 10 MG tablet Take 10 mg by mouth daily.    . potassium chloride (KLOR-CON) 10 MEQ tablet Take 1 tablet (10 mEq total) by mouth daily. 90 tablet 3  . propranolol (INDERAL) 10 MG tablet Take 1 tablet (10 mg total) by mouth 4 (four) times daily as needed. 120 tablet 1     Allergies  Allergen Reactions  . Hydroxyzine Palpitations  . Amlodipine Other (See Comments)    Severe adverse reaction per MD  . Contrast Media [Iodinated Diagnostic Agents] Hives and Nausea And Vomiting    History of present pregnancy: Pt Info/Preference:  Screening/Consents:  Labs:   EDD: Estimated Date of Delivery: 10/14/20  Establised: No LMP recorded. Patient is pregnant.  Anatomy Scan: Date: 05/29/2020 Placenta Location: anterior Genetic Screen: Panoroma:LR female AFP:  First Tri: Quad:  Office: [facility]            First PNV: 7 weeks Blood Type B/Positive/-- (11/15 0000)  Language: english Last PNV: 37.3 weeks Rhogam    Flu Vaccine:  declined   Antibody Negative (11/15 0000)  TDaP vaccine utd   GTT: Early: 5.3 Third Trimester: 91  Feeding Plan: Breast/bottle BTL: no Rubella: Immune (11/15 0000)  Contraception: ??? VBAC: no RPR: Nonreactive (11/15 0000)   Circumcision: N/A female   HBsAg: Negative (11/15 0000)  Pediatrician:  ???   HIV: Non-reactive (11/15 0000)   Prenatal Classes: no Additional [birth year]04-1973: 6/2 BPP 8/8, vertex, anterior placenta, AFI 13.5, see last growth scan below.  GBS:   Negative 5/26(For PCN allergy, check sensitivities)  Chlamydia: neg    MFM Referral/Consult:  GC: neg  Support Person: partner   PAP: ???  Pain Management: epidrual Neonatologist Referral:  Hgb Electrophoresis:  AA  Birth Plan: DCC   Hgb NOB: 12.5    28W: 11.2  Growth 5/26  OB History    Gravida  3   Para  2   Term  2   Preterm      AB      Living  2     SAB      IAB      Ectopic      Multiple  0   Live Births  2          Past Medical History:  Diagnosis Date  .  Allergy   . Gestational hypertension 04/27/2014  . Hypertension   . Medical history non-contributory   . Morbid obesity (HCC) 04/27/2014  . NSVD (normal spontaneous vaginal delivery) 04/28/2014   Past Surgical History:  Procedure Laterality Date  . BREAST SURGERY     2008   Family History: family history includes Diabetes in her father and paternal grandfather; Hypertension in her maternal grandmother and mother. Social History:  reports that she has never smoked. She has never used smokeless tobacco. She reports previous alcohol use of about 3.0 standard drinks of alcohol per week. She reports that she does not use drugs.   Prenatal Transfer Tool  Maternal Diabetes: No Genetic Screening: Normal Maternal Ultrasounds/Referrals: Normal Fetal Ultrasounds or other Referrals:  None Maternal Substance Abuse:  No Significant Maternal Medications:  None Significant Maternal Lab Results: Group B Strep negative  ROS:  Review of Systems  Constitutional: Negative.   HENT: Negative.   Eyes: Negative.   Respiratory: Negative.   Cardiovascular: Negative.   Gastrointestinal: Negative.   Genitourinary: Negative.   Musculoskeletal: Negative.   Skin: Negative.   Neurological: Negative.   Endo/Heme/Allergies: Negative.      Physical Exam: BP 121/70, T 98.6, RR 18, HR 89 There were no vitals taken for this visit.  Physical Exam Vitals and nursing note reviewed.  Constitutional:      Appearance: Normal appearance.  HENT:     Head: Normocephalic and atraumatic.     Nose: Nose normal.     Mouth/Throat:     Mouth: Mucous membranes are moist.  Eyes:     Conjunctiva/sclera: Conjunctivae normal.  Cardiovascular:     Rate and Rhythm: Normal rate and regular rhythm.     Pulses: Normal pulses.     Heart sounds: Normal heart sounds.  Pulmonary:     Effort: Pulmonary effort is normal.     Breath sounds: Normal breath sounds.  Abdominal:     General: Bowel sounds are normal.  Genitourinary:     Comments: Uterus gravida equal to dates, non tender, pelvis adequate for vaginal delivery.  Musculoskeletal:        General: Normal range of motion.     Cervical back: Normal range of motion and neck supple.  Skin:    General: Skin is warm.     Capillary Refill: Capillary refill takes less than 2 seconds.  Neurological:     General: No focal deficit present.     Mental Status: She is alert.  Psychiatric:        Mood and Affect: Mood normal.      NST: FHR baseline 150 bpm, Variability: moderate, Accelerations:present, Decelerations:  Absent= Cat 1/Reactive UC:   OCC SVE:  0/50/-2  , vertex verified  by fetal sutures.  Leopold's: Position vertex, EFW 7.5lbs via leopold's.  Pelvis proven: 8.5lbs  Labs: Results for orders placed or performed during the hospital encounter of 09/30/20 (from the past 24 hour(s))  SARS CORONAVIRUS 2 (TAT 6-24 HRS) Nasopharyngeal Nasopharyngeal Swab     Status: None   Collection Time: 09/30/20 11:27 AM   Specimen: Nasopharyngeal Swab  Result Value Ref Range   SARS Coronavirus 2 NEGATIVE NEGATIVE    Imaging:  No results found.  MAU Course: No orders of the defined types were placed in this encounter.  No orders of the defined types were placed in this encounter.   Assessment/Plan: Sarah Giles is a 30 y.o. female, G3P2002, IUP at 38.1 weeks, presenting for IOL for CHTN (On Labetalol 200mg  PO BID from early pregnancy. Check PIH labs/PCR at NOB--WNL, follow growth, delivery by 38-39 weeks, asymptomatic currently.), SVT (6 sec episode on event monitor during pregnancy, cardiology recommends MRI, declined during pregnancy. Normal EKG 04/03/19 in [facility]U, f/u by [facility] cardiology), Obesity (BMI 43) , anxiety, h/o breast reduction (2007--did make some milk after last delivery.). Covid + 05/04/2020. Pt endorse + Fm. Denies vaginal leakage. Denies vaginal bleeding. Denies feeling cxt's.   FWB: Cat 1 Fetal Tracing.   Plan: Admit to Birthing Suite per consult with  Dr 07/02/2020 Routine [facility] orders Pain med/epidural prn Plan cytotec for cervical ripening.  CHTN: Continue labetalol 200mg  BID, monitor BP, CMP, baseline PCR, CBC.  Anticipate labor progression   Su Hilt NP-C, CNM, MSN 10/01/2020, 5:29 AM

## 2020-10-01 ENCOUNTER — Inpatient Hospital Stay (HOSPITAL_COMMUNITY): Payer: BC Managed Care – PPO

## 2020-10-01 ENCOUNTER — Inpatient Hospital Stay (HOSPITAL_COMMUNITY): Payer: BC Managed Care – PPO | Admitting: Anesthesiology

## 2020-10-01 ENCOUNTER — Encounter (HOSPITAL_COMMUNITY): Payer: Self-pay | Admitting: Obstetrics & Gynecology

## 2020-10-01 ENCOUNTER — Inpatient Hospital Stay (HOSPITAL_COMMUNITY)
Admission: AD | Admit: 2020-10-01 | Discharge: 2020-10-03 | DRG: 805 | Disposition: A | Discharge status: Home / Self Care | Payer: BC Managed Care – PPO | Attending: Obstetrics & Gynecology | Admitting: Obstetrics & Gynecology

## 2020-10-01 ENCOUNTER — Other Ambulatory Visit: Payer: Self-pay

## 2020-10-01 DIAGNOSIS — O1002 Pre-existing essential hypertension complicating childbirth: Principal | ICD-10-CM | POA: Diagnosis present

## 2020-10-01 DIAGNOSIS — Z20822 Contact with and (suspected) exposure to covid-19: Secondary | ICD-10-CM | POA: Diagnosis present

## 2020-10-01 DIAGNOSIS — Z8616 Personal history of COVID-19: Secondary | ICD-10-CM | POA: Diagnosis not present

## 2020-10-01 DIAGNOSIS — I471 Supraventricular tachycardia: Secondary | ICD-10-CM | POA: Diagnosis present

## 2020-10-01 DIAGNOSIS — I10 Essential (primary) hypertension: Secondary | ICD-10-CM | POA: Diagnosis present

## 2020-10-01 DIAGNOSIS — Z3A38 38 weeks gestation of pregnancy: Secondary | ICD-10-CM

## 2020-10-01 DIAGNOSIS — Z3490 Encounter for supervision of normal pregnancy, unspecified, unspecified trimester: Secondary | ICD-10-CM | POA: Diagnosis present

## 2020-10-01 DIAGNOSIS — O99214 Obesity complicating childbirth: Secondary | ICD-10-CM | POA: Diagnosis present

## 2020-10-01 DIAGNOSIS — O9942 Diseases of the circulatory system complicating childbirth: Secondary | ICD-10-CM | POA: Diagnosis present

## 2020-10-01 LAB — COMPREHENSIVE METABOLIC PANEL
ALT: 14 U/L (ref 0–44)
AST: 17 U/L (ref 15–41)
Albumin: 3.2 g/dL — ABNORMAL LOW (ref 3.5–5.0)
Alkaline Phosphatase: 79 U/L (ref 38–126)
Anion gap: 8 (ref 5–15)
BUN: 12 mg/dL (ref 6–20)
CO2: 19 mmol/L — ABNORMAL LOW (ref 22–32)
Calcium: 9.4 mg/dL (ref 8.9–10.3)
Chloride: 108 mmol/L (ref 98–111)
Creatinine, Ser: 0.72 mg/dL (ref 0.44–1.00)
GFR, Estimated: >60 mL/min (ref >60)
Glucose, Bld: 96 mg/dL (ref 70–99)
Potassium: 3.6 mmol/L (ref 3.5–5.1)
Sodium: 135 mmol/L (ref 135–145)
Total Bilirubin: 0.3 mg/dL (ref 0.3–1.2)
Total Protein: 7 g/dL (ref 6.5–8.1)

## 2020-10-01 LAB — PROTEIN / CREATININE RATIO, URINE
Creatinine, Urine: 133.27 mg/dL
Protein Creatinine Ratio: 0.11 mg/mg{Cre} (ref 0.00–0.15)
Total Protein, Urine: 14 mg/dL

## 2020-10-01 LAB — CBC
HCT: 36.6 % (ref 36.0–46.0)
HCT: 36.1 % (ref 36.0–46.0)
Hemoglobin: 11.4 g/dL — ABNORMAL LOW (ref 12.0–15.0)
Hemoglobin: 11.1 g/dL — ABNORMAL LOW (ref 12.0–15.0)
MCH: 24.9 pg — ABNORMAL LOW (ref 26.0–34.0)
MCH: 24.7 pg — ABNORMAL LOW (ref 26.0–34.0)
MCHC: 31.1 g/dL (ref 30.0–36.0)
MCHC: 30.7 g/dL (ref 30.0–36.0)
MCV: 80.1 fL (ref 80.0–100.0)
MCV: 80.2 fL (ref 80.0–100.0)
Platelets: 306 10*3/uL (ref 150–400)
Platelets: 269 10*3/uL (ref 150–400)
RBC: 4.57 MIL/uL (ref 3.87–5.11)
RBC: 4.5 MIL/uL (ref 3.87–5.11)
RDW: 15.1 % (ref 11.5–15.5)
RDW: 15 % (ref 11.5–15.5)
WBC: 13 10*3/uL — ABNORMAL HIGH (ref 4.0–10.5)
WBC: 15.2 10*3/uL — ABNORMAL HIGH (ref 4.0–10.5)
nRBC: 0 % (ref 0.0–0.2)
nRBC: 0 % (ref 0.0–0.2)

## 2020-10-01 LAB — TYPE AND SCREEN
ABO/RH(D): B POS
Antibody Screen: NEGATIVE

## 2020-10-01 LAB — RPR: RPR Ser Ql: NONREACTIVE

## 2020-10-01 MED ORDER — LABETALOL HCL 200 MG PO TABS
200.0000 mg | ORAL_TABLET | Freq: Two times a day (BID) | ORAL | Status: DC
  Administered 2020-10-01 (×2): 200 mg via ORAL
  Filled 2020-10-01 (×2): qty 1

## 2020-10-01 MED ORDER — OXYTOCIN-SODIUM CHLORIDE 30-0.9 UT/500ML-% IV SOLN
2.5000 [IU]/h | INTRAVENOUS | Status: DC
  Administered 2020-10-02: 2.5 [IU]/h via INTRAVENOUS

## 2020-10-01 MED ORDER — EPHEDRINE 5 MG/ML INJ: 10.0000 mg | INTRAVENOUS | Status: DC | PRN

## 2020-10-01 MED ORDER — LIDOCAINE HCL (PF) 1 % IJ SOLN: 30.0000 mL | INTRAMUSCULAR | Status: DC | PRN

## 2020-10-01 MED ORDER — DIPHENHYDRAMINE HCL 50 MG/ML IJ SOLN: 12.5000 mg | INTRAMUSCULAR | Status: DC | PRN

## 2020-10-01 MED ORDER — TERBUTALINE SULFATE 1 MG/ML IJ SOLN: 0.2500 mg | Freq: Once | INTRAMUSCULAR | Status: DC | PRN

## 2020-10-01 MED ORDER — LACTATED RINGERS IV SOLN: INTRAVENOUS | Status: DC

## 2020-10-01 MED ORDER — FENTANYL-BUPIVACAINE-NACL 0.5-0.125-0.9 MG/250ML-% EP SOLN
12.0000 mL/h | EPIDURAL | Status: DC | PRN
  Administered 2020-10-01: 12 mL/h via EPIDURAL
  Filled 2020-10-01: qty 250

## 2020-10-01 MED ORDER — ACETAMINOPHEN 325 MG PO TABS: 650.0000 mg | ORAL_TABLET | ORAL | Status: DC | PRN

## 2020-10-01 MED ORDER — MISOPROSTOL 25 MCG QUARTER TABLET
25.0000 ug | ORAL_TABLET | ORAL | Status: DC | PRN
  Administered 2020-10-01: 25 ug via VAGINAL
  Filled 2020-10-01: qty 1

## 2020-10-01 MED ORDER — LACTATED RINGERS IV SOLN: 500.0000 mL | Freq: Once | INTRAVENOUS | Status: DC

## 2020-10-01 MED ORDER — ONDANSETRON HCL 4 MG/2ML IJ SOLN: 4.0000 mg | Freq: Four times a day (QID) | INTRAMUSCULAR | Status: DC | PRN

## 2020-10-01 MED ORDER — OXYTOCIN BOLUS FROM INFUSION
333.0000 mL | Freq: Once | INTRAVENOUS | Status: AC
  Administered 2020-10-02: 333 mL via INTRAVENOUS

## 2020-10-01 MED ORDER — SOD CITRATE-CITRIC ACID 500-334 MG/5ML PO SOLN: 30.0000 mL | ORAL | Status: DC | PRN

## 2020-10-01 MED ORDER — LIDOCAINE HCL (PF) 1 % IJ SOLN
INTRAMUSCULAR | Status: DC | PRN
  Administered 2020-10-01 (×2): 4 mL via EPIDURAL

## 2020-10-01 MED ORDER — FENTANYL CITRATE (PF) 100 MCG/2ML IJ SOLN: 50.0000 ug | INTRAMUSCULAR | Status: DC | PRN

## 2020-10-01 MED ORDER — PHENYLEPHRINE 40 MCG/ML (10ML) SYRINGE FOR IV PUSH (FOR BLOOD PRESSURE SUPPORT): 80.0000 ug | PREFILLED_SYRINGE | INTRAVENOUS | Status: DC | PRN

## 2020-10-01 MED ORDER — OXYTOCIN-SODIUM CHLORIDE 30-0.9 UT/500ML-% IV SOLN
1.0000 m[IU]/min | INTRAVENOUS | Status: DC
  Administered 2020-10-01: 2 m[IU]/min via INTRAVENOUS
  Filled 2020-10-01: qty 500

## 2020-10-01 MED ORDER — LACTATED RINGERS IV SOLN: 500.0000 mL | INTRAVENOUS | Status: DC | PRN

## 2020-10-01 NOTE — Progress Notes (Signed)
Subjective:    Discussed mechanical ripening and pt agrees. During [facility] balloon placement, pt had a contraction that expelled the balloon. Repeat VE reveals cervix is not 2.5 cm. Discussed sweeping membranes and starting Pitocin and pt agrees.   Objective:    VS: BP (!) 146/92   Pulse 79   Temp 98.6 F (37 C) (Oral)   Resp 18   Ht 5\' 7"  (1.702 m)   Wt 132.3 kg   BMI 45.67 kg/m  FHR : baseline 135 / variability moderate / accelerations present / absent decelerations Toco: contractions every 2-4 minutes  Membranes: intact Dilation: 2.5 Effacement (%): 50 Station: -3 Presentation: Vertex Exam by:: [email or web address], CNM   Assessment/Plan:   30 y.o. 38 [redacted]w[redacted]d IOL for CHTN  Labor: S/P Cytotec x1, starting Pitocin now Preeclampsia:  no signs or symptoms of toxicity, labs stable and mild range BP Fetal Wellbeing:  Category I Pain Control:  per pt's request I/D:  GBS neg Anticipated MOD:  NSVD  [redacted]w[redacted]d MSN, CNM 10/01/2020 12:45 PM

## 2020-10-01 NOTE — Progress Notes (Signed)
Subjective:    Discussed amniotomy and pt agrees.   Objective:    VS: BP (!) 141/85   Pulse 82   Temp 98.6 F (37 C) (Oral)   Resp 18   Ht 5\' 7"  (1.702 m)   Wt 132.3 kg   BMI 45.67 kg/m  FHR : baseline 140 / variability moderate / accelerations present / absent decelerations Toco: contractions every 2-3 minutes  Membranes: AROM, clear Dilation: 3 Effacement (%): 70 Station: -3 Presentation: Vertex Exam by:: [email or web address] CNM Pitocin 18 mU/min  Assessment/Plan:   30 y.o. G3P2002 [redacted]w[redacted]d IOL for CHTN  Labor: Cytotec x1, progressing on Pitocin, AROM performed, will hold Pitocin @ 18 mU/min Preeclampsia:  no signs or symptoms of toxicity and labs stable Fetal Wellbeing:  Category I Pain Control:  considering epidural, discussed all options I/D:  GBS neg Anticipated MOD:  NSVD  [redacted]w[redacted]d MSN, CNM 10/01/2020 6:45 PM

## 2020-10-01 NOTE — Progress Notes (Signed)
Subjective:    Comfortable w/ epidural. Spouse remains present and supportive.   Objective:    VS: BP (!) 114/92   Pulse 78   Temp 98.5 F (36.9 C) (Oral)   Resp 16   Ht 5\' 7"  (1.702 m)   Wt 132.3 kg   BMI 45.67 kg/m  FHR : baseline 140 / variability moderate / accelerations present / variable decelerations IUPC: placed Membranes: AROM x 4 hrs Dilation: 5 Effacement (%): 80 Station: -2 Presentation: Vertex Exam by:: [email or web address], CNM Pitocin 18 mU/min  Assessment/Plan:   30 y.o. G3P2002 [redacted]w[redacted]d IOL for CHTN    -normotensive    -continue Labetalol 200 mg PO BID  Labor: S/P Cytotec x1, now Progressing on Pitocin Preeclampsia:  labs stable Fetal Wellbeing:  Category I Pain Control:  Epidural I/D:  GBS neg Anticipated MOD:  NSVD  [redacted]w[redacted]d MSN, CNM 10/01/2020 10:52 PM

## 2020-10-01 NOTE — Anesthesia Preprocedure Evaluation (Signed)
Anesthesia Evaluation  Patient identified by MRN, date of birth, ID band Patient awake    Reviewed: Allergy & Precautions, Patient's Chart, lab work & pertinent test results  History of Anesthesia Complications Negative for: history of anesthetic complications  Airway Mallampati: II  TM Distance: >3 FB Neck ROM: Full    Dental no notable dental hx.    Pulmonary neg pulmonary ROS,    Pulmonary exam normal        Cardiovascular hypertension, Pt. on medications Normal cardiovascular exam     Neuro/Psych negative neurological ROS  negative psych ROS   GI/Hepatic negative GI ROS, Neg liver ROS,   Endo/Other  Morbid obesity  Renal/GU negative Renal ROS  negative genitourinary   Musculoskeletal negative musculoskeletal ROS (+)   Abdominal   Peds  Hematology negative hematology ROS (+)   Anesthesia Other Findings Day of surgery medications reviewed with patient.  Reproductive/Obstetrics (+) Pregnancy                             Anesthesia Physical Anesthesia Plan  ASA: III  Anesthesia Plan: Epidural   Post-op Pain Management:    Induction:   PONV Risk Score and Plan: Treatment may vary due to age or medical condition  Airway Management Planned: Natural Airway  Additional Equipment:   Intra-op Plan:   Post-operative Plan:   Informed Consent: I have reviewed the patients History and Physical, chart, labs and discussed the procedure including the risks, benefits and alternatives for the proposed anesthesia with the patient or authorized representative who has indicated his/her understanding and acceptance.       Plan Discussed with:   Anesthesia Plan Comments:         Anesthesia Quick Evaluation

## 2020-10-01 NOTE — Anesthesia Procedure Notes (Signed)
Epidural Patient location during procedure: OB Start time: 10/01/2020 8:10 PM End time: 10/01/2020 8:17 PM  Staffing Anesthesiologist: Kaylyn Layer, MD Performed: anesthesiologist   Preanesthetic Checklist Completed: patient identified, IV checked, risks and benefits discussed, monitors and equipment checked, pre-op evaluation and timeout performed  Epidural Patient position: sitting Prep: DuraPrep and site prepped and draped Patient monitoring: continuous pulse ox, blood pressure and heart rate Approach: midline Location: L2-L3 Injection technique: LOR air  Needle:  Needle type: Tuohy  Needle gauge: 17 G Needle length: 9 cm Needle insertion depth: 7 cm Catheter type: closed end flexible Catheter size: 19 Gauge Catheter at skin depth: 12 cm Test dose: negative and Other (1% lidocaine)  Assessment Events: blood not aspirated, injection not painful, no injection resistance, no paresthesia and negative IV test  Additional Notes Patient identified. Risks, benefits, and alternatives discussed with patient including but not limited to bleeding, infection, nerve damage, paralysis, failed block, incomplete pain control, headache, blood pressure changes, nausea, vomiting, reactions to medication, itching, and postpartum back pain. Confirmed with bedside nurse the patient's most recent platelet count. Confirmed with patient that they are not currently taking any anticoagulation, have any bleeding history, or any family history of bleeding disorders. Patient expressed understanding and wished to proceed. All questions were answered. Sterile technique was used throughout the entire procedure. Please see nursing notes for vital signs.   Two attempts required, both at L2-3. Test dose was given through epidural catheter and negative prior to continuing to dose epidural or start infusion. Warning signs of high block given to the patient including shortness of breath, tingling/numbness in hands,  complete motor block, or any concerning symptoms with instructions to call for help. Patient was given instructions on fall risk and not to get out of bed. All questions and concerns addressed with instructions to call with any issues or inadequate analgesia.  Reason for block:procedure for pain

## 2020-10-02 ENCOUNTER — Encounter (HOSPITAL_COMMUNITY): Payer: Self-pay | Admitting: Obstetrics & Gynecology

## 2020-10-02 LAB — CBC
HCT: 36.3 % (ref 36.0–46.0)
Hemoglobin: 11.5 g/dL — ABNORMAL LOW (ref 12.0–15.0)
MCH: 24.9 pg — ABNORMAL LOW (ref 26.0–34.0)
MCHC: 31.7 g/dL (ref 30.0–36.0)
MCV: 78.7 fL — ABNORMAL LOW (ref 80.0–100.0)
Platelets: 259 10*3/uL (ref 150–400)
RBC: 4.61 MIL/uL (ref 3.87–5.11)
RDW: 14.9 % (ref 11.5–15.5)
WBC: 16.9 10*3/uL — ABNORMAL HIGH (ref 4.0–10.5)
nRBC: 0 % (ref 0.0–0.2)

## 2020-10-02 MED ORDER — LABETALOL HCL 200 MG PO TABS
200.0000 mg | ORAL_TABLET | Freq: Two times a day (BID) | ORAL | Status: DC
  Administered 2020-10-02 – 2020-10-03 (×3): 200 mg via ORAL
  Filled 2020-10-02 (×3): qty 1

## 2020-10-02 MED ORDER — DIPHENHYDRAMINE HCL 25 MG PO CAPS: 25.0000 mg | ORAL_CAPSULE | Freq: Four times a day (QID) | ORAL | Status: DC | PRN

## 2020-10-02 MED ORDER — BENZOCAINE-MENTHOL 20-0.5 % EX AERO: 1.0000 "application " | INHALATION_SPRAY | CUTANEOUS | Status: DC | PRN

## 2020-10-02 MED ORDER — ACETAMINOPHEN 325 MG PO TABS: 650.0000 mg | ORAL_TABLET | ORAL | Status: DC | PRN

## 2020-10-02 MED ORDER — PRENATAL MULTIVITAMIN CH
1.0000 | ORAL_TABLET | Freq: Every day | ORAL | Status: DC
  Administered 2020-10-02 – 2020-10-03 (×2): 1 via ORAL
  Filled 2020-10-02 (×2): qty 1

## 2020-10-02 MED ORDER — ONDANSETRON HCL 4 MG/2ML IJ SOLN: 4.0000 mg | INTRAMUSCULAR | Status: DC | PRN

## 2020-10-02 MED ORDER — TETANUS-DIPHTH-ACELL PERTUSSIS 5-2.5-18.5 LF-MCG/0.5 IM SUSY: 0.5000 mL | PREFILLED_SYRINGE | Freq: Once | INTRAMUSCULAR | Status: DC

## 2020-10-02 MED ORDER — IBUPROFEN 600 MG PO TABS
600.0000 mg | ORAL_TABLET | Freq: Four times a day (QID) | ORAL | Status: DC
  Administered 2020-10-02 – 2020-10-03 (×5): 600 mg via ORAL
  Filled 2020-10-02 (×5): qty 1

## 2020-10-02 MED ORDER — COCONUT OIL OIL: 1.0000 "application " | TOPICAL_OIL | Status: DC | PRN

## 2020-10-02 MED ORDER — SENNOSIDES-DOCUSATE SODIUM 8.6-50 MG PO TABS
2.0000 | ORAL_TABLET | ORAL | Status: DC
  Administered 2020-10-02: 2 via ORAL
  Filled 2020-10-02 (×2): qty 2

## 2020-10-02 MED ORDER — DIBUCAINE (PERIANAL) 1 % EX OINT: 1.0000 "application " | TOPICAL_OINTMENT | CUTANEOUS | Status: DC | PRN

## 2020-10-02 MED ORDER — WITCH HAZEL-GLYCERIN EX PADS: 1.0000 "application " | MEDICATED_PAD | CUTANEOUS | Status: DC | PRN

## 2020-10-02 MED ORDER — ONDANSETRON HCL 4 MG PO TABS: 4.0000 mg | ORAL_TABLET | ORAL | Status: DC | PRN

## 2020-10-02 MED ORDER — SIMETHICONE 80 MG PO CHEW: 80.0000 mg | CHEWABLE_TABLET | ORAL | Status: DC | PRN

## 2020-10-02 NOTE — Anesthesia Postprocedure Evaluation (Signed)
Anesthesia Post Note  Patient: Sarah Giles  Procedure(s) Performed: AN AD HOC LABOR EPIDURAL     Patient location during evaluation: Mother Baby Anesthesia Type: Epidural Level of consciousness: awake and alert and oriented Pain management: satisfactory to patient Vital Signs Assessment: post-procedure vital signs reviewed and stable Respiratory status: spontaneous breathing and nonlabored ventilation Cardiovascular status: stable Postop Assessment: no headache, no backache, no signs of nausea or vomiting, adequate PO intake and patient able to bend at knees (patient up walking) Anesthetic complications: no   No complications documented.  Last Vitals:  Vitals:   10/02/20 0937 10/02/20 1244  BP: 139/74 132/80  Pulse: 73 66  Resp: 18 18  Temp: 36.6 C 36.7 C  SpO2: 100% 100%    Last Pain:  Vitals:   10/02/20 1244  TempSrc: Oral  PainSc: 0-No pain   Pain Goal:                   Tysha Grismore

## 2020-10-02 NOTE — Lactation Note (Signed)
This note was copied from a baby's chart. Lactation Consultation Note  Patient Name: Sarah Giles Today's Date: 10/02/2020 Reason for consult: Initial assessment;Early term 37-38.6wks Age:31 hours  Initial visit to 7 hours old infant of a P3 mother. Mother is attempting latch. LC assisted with latch using teacup hold. Mother is proficient with hand expression and has breastfeeding experience. Parents supplement with formula after each feeding following guidelines.   Plan: 1-Skin to skin 2-Aim for a deep, comfortable latch 3-Breastfeeding on demand or 8-12 times in 24h period. 4-Keep infant awake during breastfeeding session: massaging breast, infant's hand/shoulder/feet 5-Monitor voids and stools as signs good intake.  6-Encouraged maternal rest, hydration and food intake.  7-Contact LC as needed for feeds/support/concerns/questions   All questions answered at this time. Provided Lactation services brochure and promoted [employer] booklet information.  Maternal Data Has patient been taught Hand Expression?: No Does the patient have breastfeeding experience prior to this delivery?: Yes How long did the patient breastfeed?: up to 4 months x2  Feeding Mother's Current Feeding Choice: Breast Milk and Formula Nipple Type: Extra Slow Flow  LATCH Score Latch: Grasps breast easily, tongue down, lips flanged, rhythmical sucking.  Audible Swallowing: Spontaneous and intermittent  Type of Nipple: Everted at rest and after stimulation  Comfort (Breast/Nipple): Soft / non-tender  Hold (Positioning): No assistance needed to correctly position infant at breast.  LATCH Score: 10  Interventions Interventions: Assisted with latch;Breast feeding basics reviewed;Skin to skin;Education  Discharge Pump: Personal (mother owns a ha'ka) WIC Program: No  Consult Status Consult Status: Follow-up Date: 10/03/20 Follow-up type: In-patient    Sarah Giles 10/02/2020, 2:05 PM

## 2020-10-03 LAB — BIRTH TISSUE RECOVERY COLLECTION (PLACENTA DONATION)

## 2020-10-03 LAB — CBC
HCT: 34.4 % — ABNORMAL LOW (ref 36.0–46.0)
Hemoglobin: 10.7 g/dL — ABNORMAL LOW (ref 12.0–15.0)
MCH: 24.7 pg — ABNORMAL LOW (ref 26.0–34.0)
MCHC: 31.1 g/dL (ref 30.0–36.0)
MCV: 79.4 fL — ABNORMAL LOW (ref 80.0–100.0)
Platelets: 264 10*3/uL (ref 150–400)
RBC: 4.33 MIL/uL (ref 3.87–5.11)
RDW: 14.9 % (ref 11.5–15.5)
WBC: 12.3 10*3/uL — ABNORMAL HIGH (ref 4.0–10.5)
nRBC: 0 % (ref 0.0–0.2)

## 2020-10-03 MED ORDER — IBUPROFEN 600 MG PO TABS: 600.0000 mg | ORAL_TABLET | Freq: Four times a day (QID) | ORAL | 0 refills | Status: DC

## 2020-10-03 MED ORDER — LABETALOL HCL 200 MG PO TABS: 200.0000 mg | ORAL_TABLET | Freq: Two times a day (BID) | ORAL | 3 refills | Status: DC

## 2020-10-03 MED ORDER — ACETAMINOPHEN 325 MG PO TABS: 650.0000 mg | ORAL_TABLET | ORAL | Status: DC | PRN

## 2020-10-03 NOTE — Lactation Note (Signed)
This note was copied from a baby's chart. Lactation Consultation Note  Patient Name: Girl Kristian Hazzard [id number]'N Date: 10/03/2020 Reason for consult: Follow-up assessment;Early term 37-38.6wks Age:31 hours   P3 mother whose infant is now 44 hours old.  This is an ETI at 38+2 weeks.  Mother's feeding preference is breast/formula.  She had a breast reduction at the age of 52 and has to supplement with breast feeding.  Mother had just finished feeding baby when I arrived.  She was awake and content.  Reviewed feeding/pumping plan for after discharge.  Mother plans to ask the pediatrician about switching formula due to baby not tolerating current formula well.  Mother feels like she spits up a lot.  Encouraged her to speak with the pediatrician on rounds.  Mother will continue to breast feed, supplement and pump after discharge keeping to approximately every three hours.  Encouraged to increase volumes today to between 10-20 mls/feeding and more as desired.  Mother verbalized understanding.  Mother has a DEBP for home use.  Father present and supportive.  OB has discharged mother and family is ready to go home.  Mother questioning whether or not she should call the office, however, I reassured her that the pediatrician will be rounding; will look to see if I see this pediatrician in either nursery.  Parents appreciative.   Maternal Data Has patient been taught Hand Expression?: Yes Does the patient have breastfeeding experience prior to this delivery?: Yes How long did the patient breastfeed?: 4 months with each of her other two children  Feeding Mother's Current Feeding Choice: Breast Milk and Formula  LATCH Score                    Lactation Tools Discussed/Used    Interventions Interventions: Education  Discharge Discharge Education: Engorgement and breast care Pump: Personal;DEBP;Manual  Consult Status Consult Status: Complete Date: 10/03/20 Follow-up type: Call as  needed    Irene Pap Jennafer Gladue 10/03/2020, 12:38 PM

## 2020-10-03 NOTE — Discharge Summary (Signed)
Postpartum Discharge Summary  Date of Service updated 10/03/20     Patient Name: Sarah Giles DOB: [birth year] MRN: [id number]  Date of admission: 10/01/2020 Delivery date:10/02/2020  Delivering provider: Burman Foster B  Date of discharge: 10/03/2020  Admitting diagnosis: Chronic hypertension [I10] Intrauterine pregnancy: [redacted]w[redacted]d    Secondary diagnosis:  Active Problems:   Encounter for planned induction of labor   Chronic hypertension   SVD (6/8)  Additional problems: none    Discharge diagnosis: Term Pregnancy Delivered and CHTN                                              Post partum procedures: none Augmentation: AROM, Pitocin, and Cytotec Complications: None  Hospital course: Induction of Labor With Vaginal Delivery   30y.o. yo G3P3003 at 372w2das admitted to the hospital 10/01/2020 for induction of labor.  Indication for induction:  chronic hypertension .  Patient had an uncomplicated labor course as follows: Membrane Rupture Time/Date: 6:36 PM ,10/01/2020   Delivery Method:Vaginal, Spontaneous  Episiotomy: None  Lacerations:  None  Details of delivery can be found in separate delivery note.  Patient had a routine postpartum course. Patient is discharged home 10/03/20.  Newborn Data: Birth date:10/02/2020  Birth time:6:16 AM  Gender:Female  Living status:Living  Apgars:8 ,9  Weight:3011 g   Magnesium Sulfate received: No BMZ received: No Rhophylac:N/A MMR:N/A Transfusion:No  Physical exam  Vitals:   10/02/20 1244 10/02/20 1831 10/02/20 2332 10/03/20 0534  BP: 132/80 137/86 127/81 130/89  Pulse: 66 71 89 67  Resp: 18 18 18 18   Temp: 98.1 F (36.7 C) 98.3 F (36.8 C) 99 F (37.2 C) 97.7 F (36.5 C)  TempSrc: Oral Oral Oral Oral  SpO2: 100% 100% 99% 100%  Weight:      Height:       General: alert, cooperative, and no distress Lochia: appropriate Uterine Fundus: firm Incision: N/A DVT Evaluation: No evidence of DVT seen on physical exam. No cords or  calf tenderness. No significant calf/ankle edema. Labs: Lab Results  Component Value Date   WBC 12.3 (H) 10/03/2020   HGB 10.7 (L) 10/03/2020   HCT 34.4 (L) 10/03/2020   MCV 79.4 (L) 10/03/2020   PLT 264 10/03/2020   CMP Latest Ref Rng & Units 10/01/2020  Glucose 70 - 99 mg/dL 96  BUN 6 - 20 mg/dL 12  Creatinine 0.44 - 1.00 mg/dL 0.72  Sodium 135 - 145 mmol/L 135  Potassium 3.5 - 5.1 mmol/L 3.6  Chloride 98 - 111 mmol/L 108  CO2 22 - 32 mmol/L 19(L)  Calcium 8.9 - 10.3 mg/dL 9.4  Total Protein 6.5 - 8.1 g/dL 7.0  Total Bilirubin 0.3 - 1.2 mg/dL 0.3  Alkaline Phos 38 - 126 U/L 79  AST 15 - 41 U/L 17  ALT 0 - 44 U/L 14   Edinburgh Score: Edinburgh Postnatal Depression Scale Screening Tool 10/03/2020  I have been able to laugh and see the funny side of things. 0  I have looked forward with enjoyment to things. 0  I have blamed myself unnecessarily when things went wrong. 1  I have been anxious or worried for no good reason. 2  I have felt scared or panicky for no good reason. 1  Things have been getting on top of me. 1  I have been so unhappy  that I have had difficulty sleeping. 0  I have felt sad or miserable. 0  I have been so unhappy that I have been crying. 0  The thought of harming myself has occurred to me. 0  Edinburgh Postnatal Depression Scale Total 5      After visit meds:  Allergies as of 10/03/2020       Reactions   Hydroxyzine Palpitations   Amlodipine Other (See Comments)   Severe adverse reaction per MD   Contrast Media [iodinated Diagnostic Agents] Hives, Nausea And Vomiting        Medication List     STOP taking these medications    hydrochlorothiazide 25 MG tablet Commonly known as: HYDRODIURIL   propranolol 10 MG tablet Commonly known as: INDERAL       TAKE these medications    acetaminophen 325 MG tablet Commonly known as: Tylenol Take 2 tablets (650 mg total) by mouth every 4 (four) hours as needed (for pain scale < 4).    ibuprofen 600 MG tablet Commonly known as: ADVIL Take 1 tablet (600 mg total) by mouth every 6 (six) hours.   labetalol 200 MG tablet Commonly known as: NORMODYNE Take 1 tablet (200 mg total) by mouth 2 (two) times daily.   loratadine 10 MG tablet Commonly known as: CLARITIN Take 10 mg by mouth daily.   potassium chloride 10 MEQ tablet Commonly known as: KLOR-CON Take 1 tablet (10 mEq total) by mouth daily.         Discharge home in stable condition Infant Feeding: Bottle and Breast Infant Disposition:home with mother Discharge instruction: per After Visit Summary and Postpartum booklet. Activity: Advance as tolerated. Pelvic rest for 6 weeks.  Diet: low salt diet Anticipated Birth Control: Unsure and all options reviewed, pt considering Phexxi until spouse schedules vasectomy appointment Postpartum Appointment:6 weeks Additional Postpartum F/U: BP check 1 week Future Appointments:No future appointments. Follow up Visit:  [facility] Follow up in 1 week(s).   Specialty: Obstetrics and Gynecology Why: Return to [facility] in 1 week for BP check and then in 6 weeks for regular postpartum visit. Contact information: [address] [place] [address] [phone]                    10/03/2020 Arrie Eastern, CNM

## 2020-11-25 DIAGNOSIS — R253 Fasciculation: Secondary | ICD-10-CM | POA: Insufficient documentation

## 2021-01-10 DIAGNOSIS — I4729 Other ventricular tachycardia: Secondary | ICD-10-CM | POA: Insufficient documentation

## 2021-05-30 DIAGNOSIS — R002 Palpitations: Secondary | ICD-10-CM | POA: Diagnosis not present

## 2021-05-30 DIAGNOSIS — I4729 Other ventricular tachycardia: Secondary | ICD-10-CM | POA: Diagnosis not present

## 2021-05-30 DIAGNOSIS — I10 Essential (primary) hypertension: Secondary | ICD-10-CM | POA: Diagnosis not present

## 2021-06-04 DIAGNOSIS — F411 Generalized anxiety disorder: Secondary | ICD-10-CM | POA: Diagnosis not present

## 2021-06-10 DIAGNOSIS — R002 Palpitations: Secondary | ICD-10-CM | POA: Diagnosis not present

## 2021-06-10 DIAGNOSIS — I4729 Other ventricular tachycardia: Secondary | ICD-10-CM | POA: Diagnosis not present

## 2021-06-14 IMAGING — CT CT ANGIO CHEST
2 of 7 series · 18 of 46 positions shown · IV contrast (APPLIED)
Comparison: None.

CLINICAL DATA: Postpartum. Shortness of breath. Lightheadedness.

EXAM:
CT ANGIOGRAPHY CHEST WITH CONTRAST
TECHNIQUE: Multidetector CT imaging of the chest was performed using the
standard protocol during bolus administration of intravenous
contrast. Multiplanar CT image reconstructions and MIPs were
obtained to evaluate the vascular anatomy.
Following the IV administration of contrast patient reported it
itchiness and had 1 episode of emesis.
CONTRAST:  65mL OMNIPAQUE IOHEXOL 350 MG/ML SOLN

[Series 8: thins · axial · 0.74mm/px · z∈[-249,+33]mm · 15 of 455 slices shown]
[im 26/455  lung]
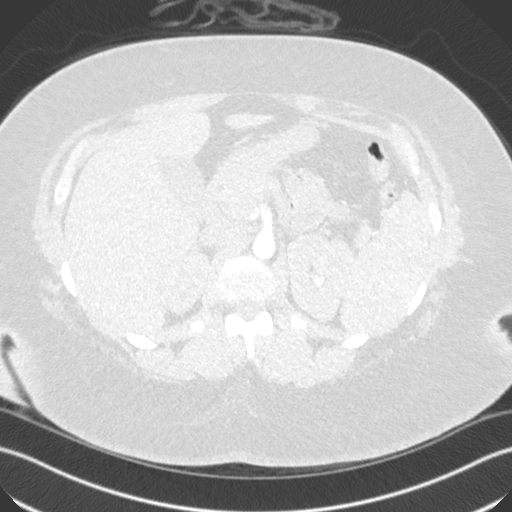
[im 51/455  soft-tissue]
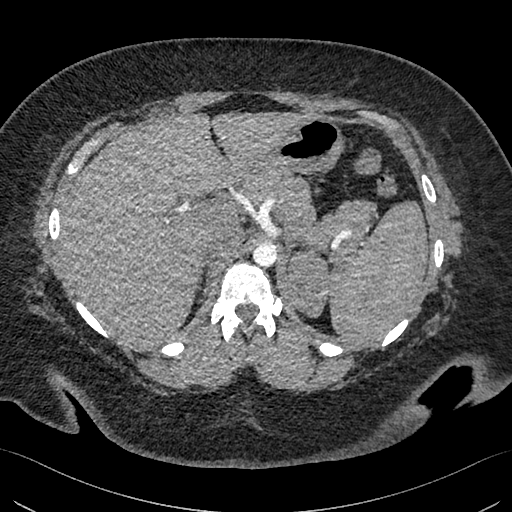
[im 76/455  lung]
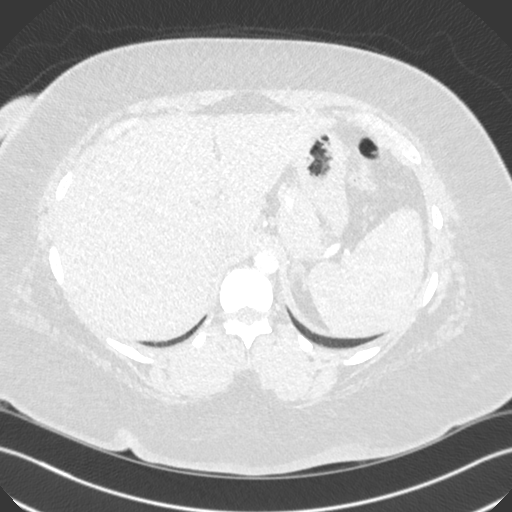
[im 101/455  soft-tissue]
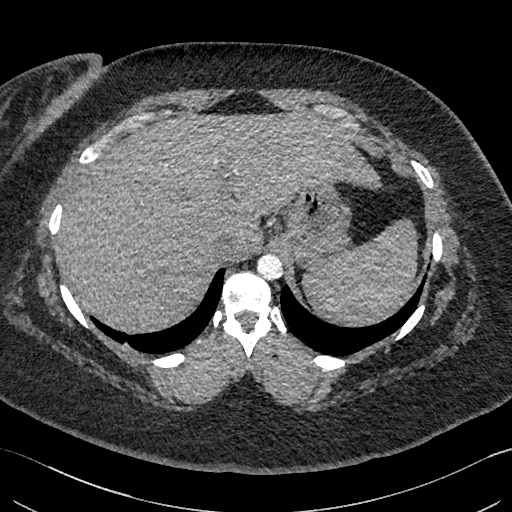
[im 152/455  lung]
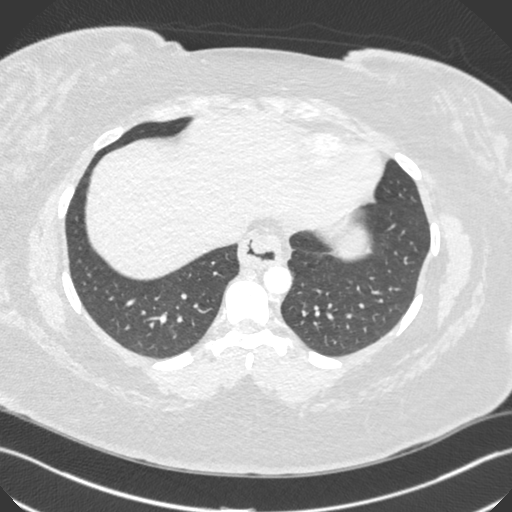
[im 177/455  soft-tissue]
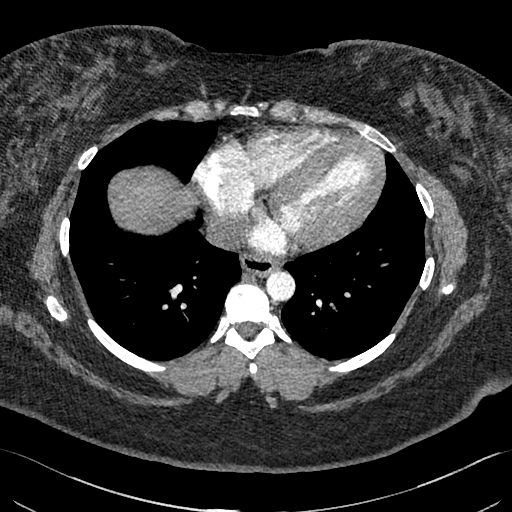
[im 202/455  lung]
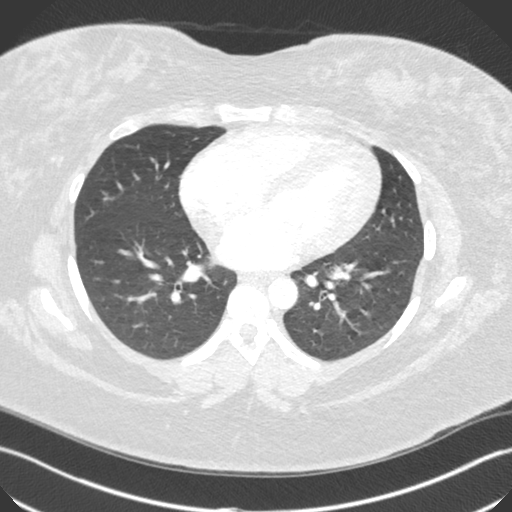
[im 228/455  soft-tissue]
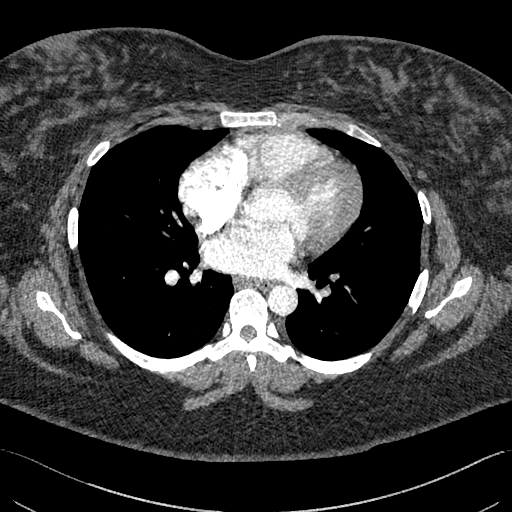
[im 253/455  lung]
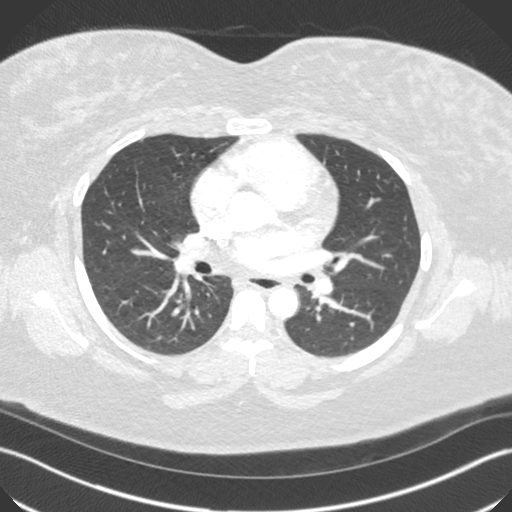
[im 278/455  soft-tissue]
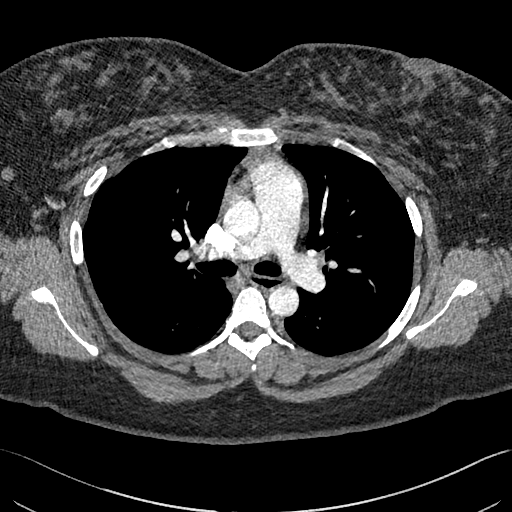
[im 303/455  lung]
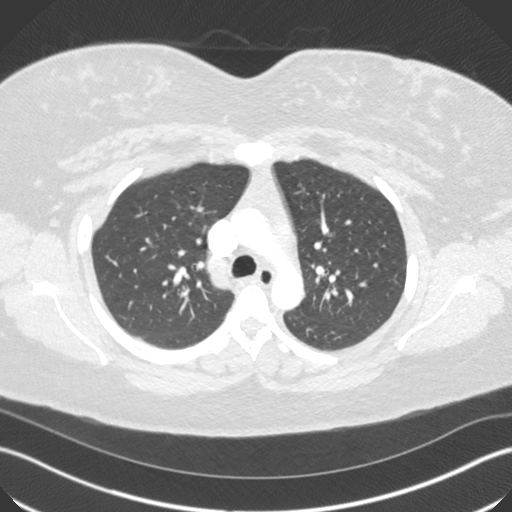
[im 354/455  soft-tissue]
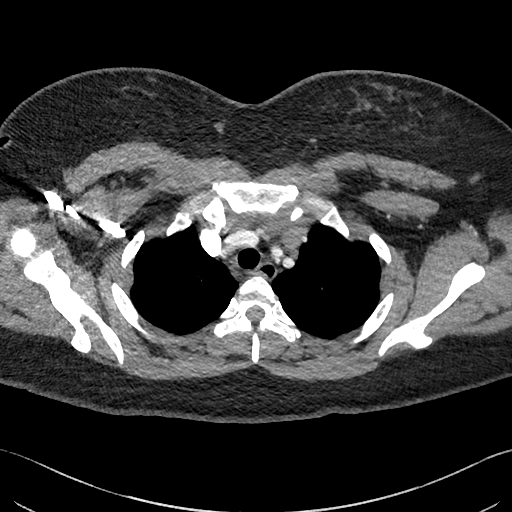
[im 379/455  lung]
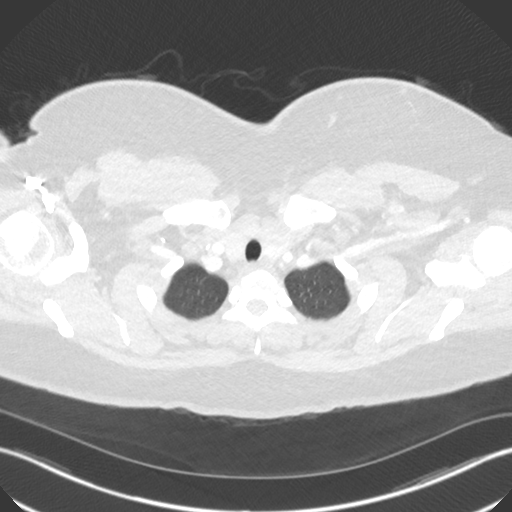
[im 404/455  soft-tissue]
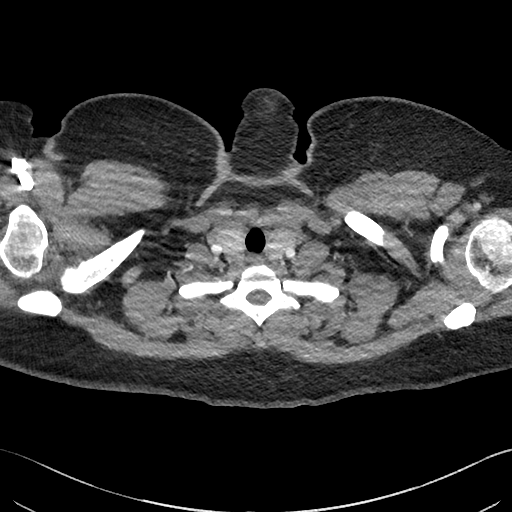
[im 429/455  lung]
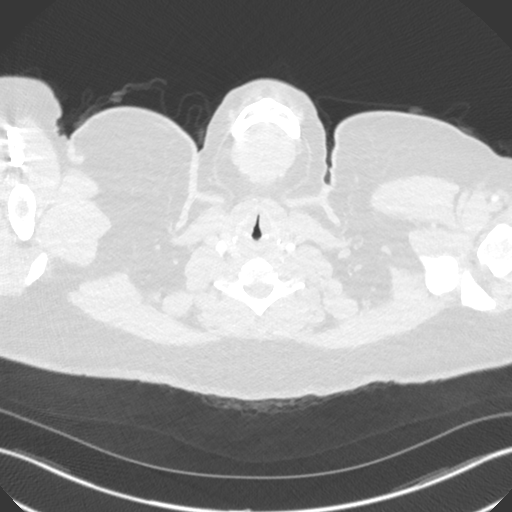

[Series 9: cor · coronal · 0.65mm/px · 3 of 167 slices shown]
[im 42/167  soft-tissue]
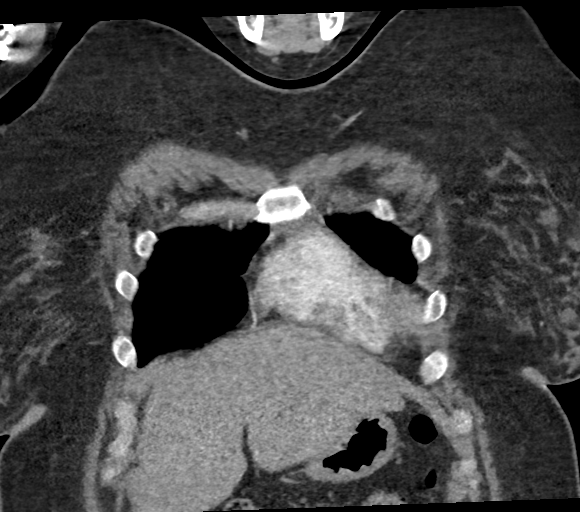
[im 84/167  soft-tissue]
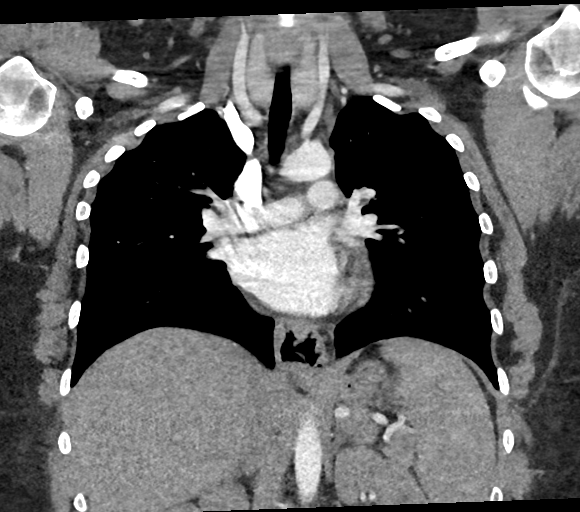
[im 125/167  soft-tissue]
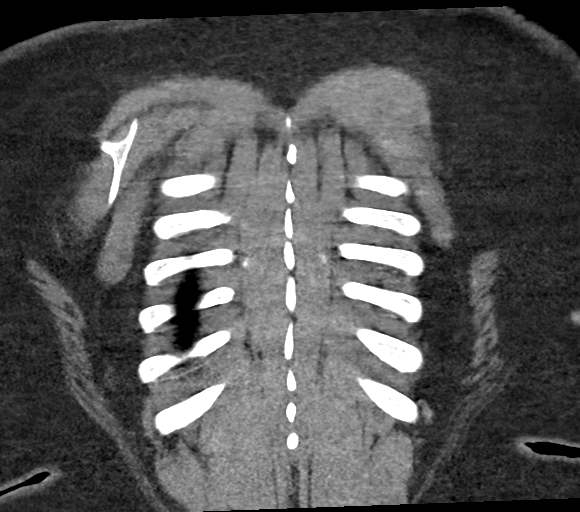

[18 of 46 positions shown; findings below may reference images not displayed]

FINDINGS: Cardiovascular: Diminished exam detail due to suboptimal pulmonary
arterial opacification. The main pulmonary artery is patent. No
acute pulmonary emboli identified. No central obstructing pulmonary
embolus identified. No lobar pulmonary artery filling defects
identified. Beyond the level of the lobar pulmonary arteries there
is suboptimal opacification of the segmental and subsegmental
pulmonary arteries.

Mediastinum/Nodes: No enlarged mediastinal, hilar, or axillary lymph
nodes. Thyroid gland, trachea, and esophagus demonstrate no
significant findings. Small hiatal hernia noted.

Lungs/Pleura: Lungs are clear. No pleural effusion or pneumothorax.

Upper Abdomen: No acute abnormality.

Musculoskeletal: No chest wall abnormality. No acute or significant
osseous findings.

Review of the MIP images confirms the above findings.
IMPRESSION: 1. Diminished exam detail due to suboptimal pulmonary arterial
opacification.
Itching and 1 episode of emesis noted following the IV
administration of contrast material. For this reason, a second
attempt to achieve more optimal pulmonary arterial opacification was
not performed.
2. No central obstructing pulmonary embolus identified.
3. No active cardiopulmonary abnormalities identified.

## 2021-06-17 DIAGNOSIS — I4729 Other ventricular tachycardia: Secondary | ICD-10-CM | POA: Diagnosis not present

## 2021-06-17 DIAGNOSIS — R002 Palpitations: Secondary | ICD-10-CM | POA: Diagnosis not present

## 2021-06-17 DIAGNOSIS — I10 Essential (primary) hypertension: Secondary | ICD-10-CM | POA: Diagnosis not present

## 2021-06-19 DIAGNOSIS — F418 Other specified anxiety disorders: Secondary | ICD-10-CM | POA: Diagnosis not present

## 2021-06-19 DIAGNOSIS — I10 Essential (primary) hypertension: Secondary | ICD-10-CM | POA: Diagnosis not present

## 2021-06-19 DIAGNOSIS — R300 Dysuria: Secondary | ICD-10-CM | POA: Diagnosis not present

## 2021-06-19 DIAGNOSIS — I4729 Other ventricular tachycardia: Secondary | ICD-10-CM | POA: Diagnosis not present

## 2021-07-03 DIAGNOSIS — F411 Generalized anxiety disorder: Secondary | ICD-10-CM | POA: Diagnosis not present

## 2021-07-07 DIAGNOSIS — F411 Generalized anxiety disorder: Secondary | ICD-10-CM | POA: Diagnosis not present

## 2021-07-21 DIAGNOSIS — F411 Generalized anxiety disorder: Secondary | ICD-10-CM | POA: Diagnosis not present

## 2021-07-30 DIAGNOSIS — R002 Palpitations: Secondary | ICD-10-CM | POA: Diagnosis not present

## 2021-07-30 DIAGNOSIS — I10 Essential (primary) hypertension: Secondary | ICD-10-CM | POA: Diagnosis not present

## 2021-07-30 DIAGNOSIS — R748 Abnormal levels of other serum enzymes: Secondary | ICD-10-CM | POA: Diagnosis not present

## 2021-08-07 IMAGING — CR DG CHEST 2V
2 series · 2 of 2 positions shown · non-contrast
Comparison: 05/03/2019

CLINICAL DATA: Chest pain and pressure

EXAM:
CHEST - 2 VIEW

[w chest pa]
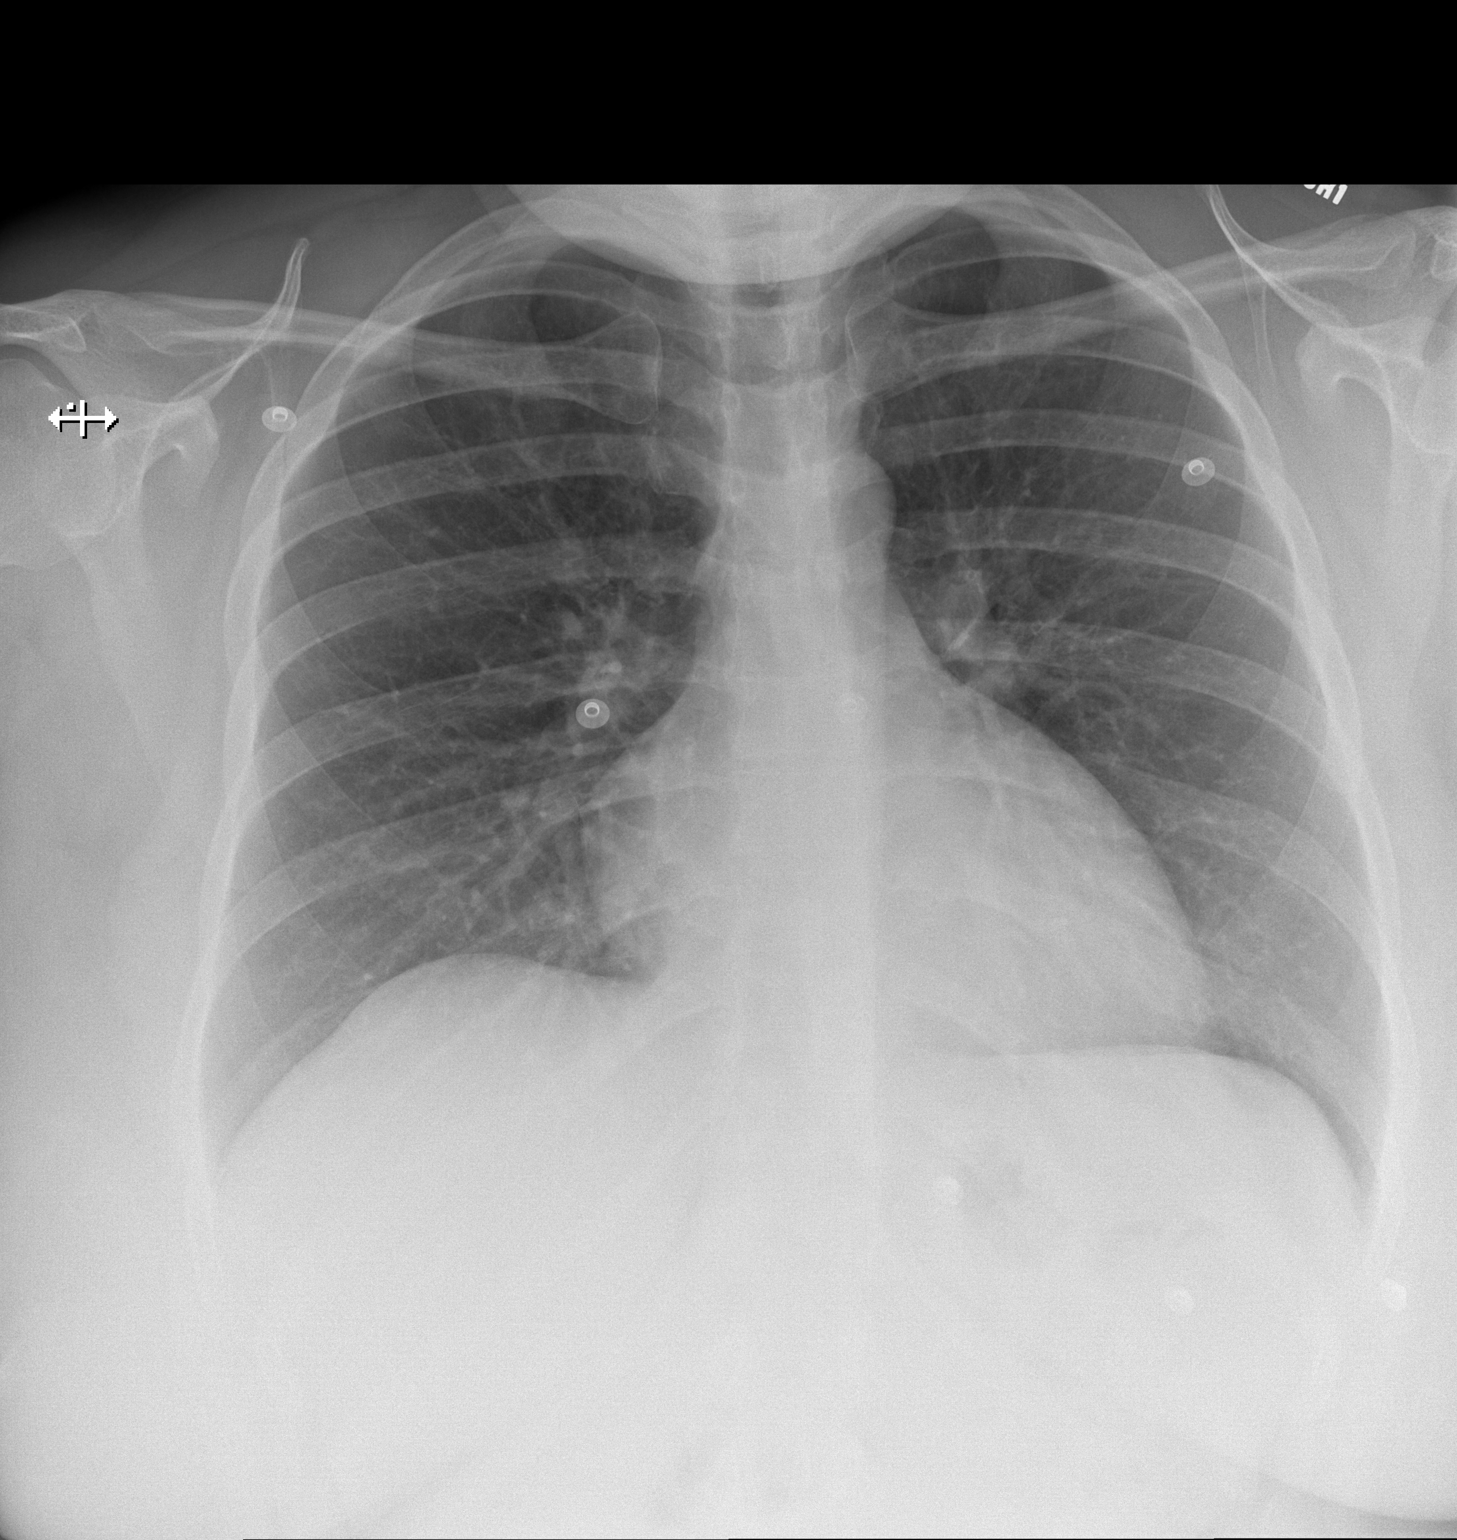

[w chest lat]
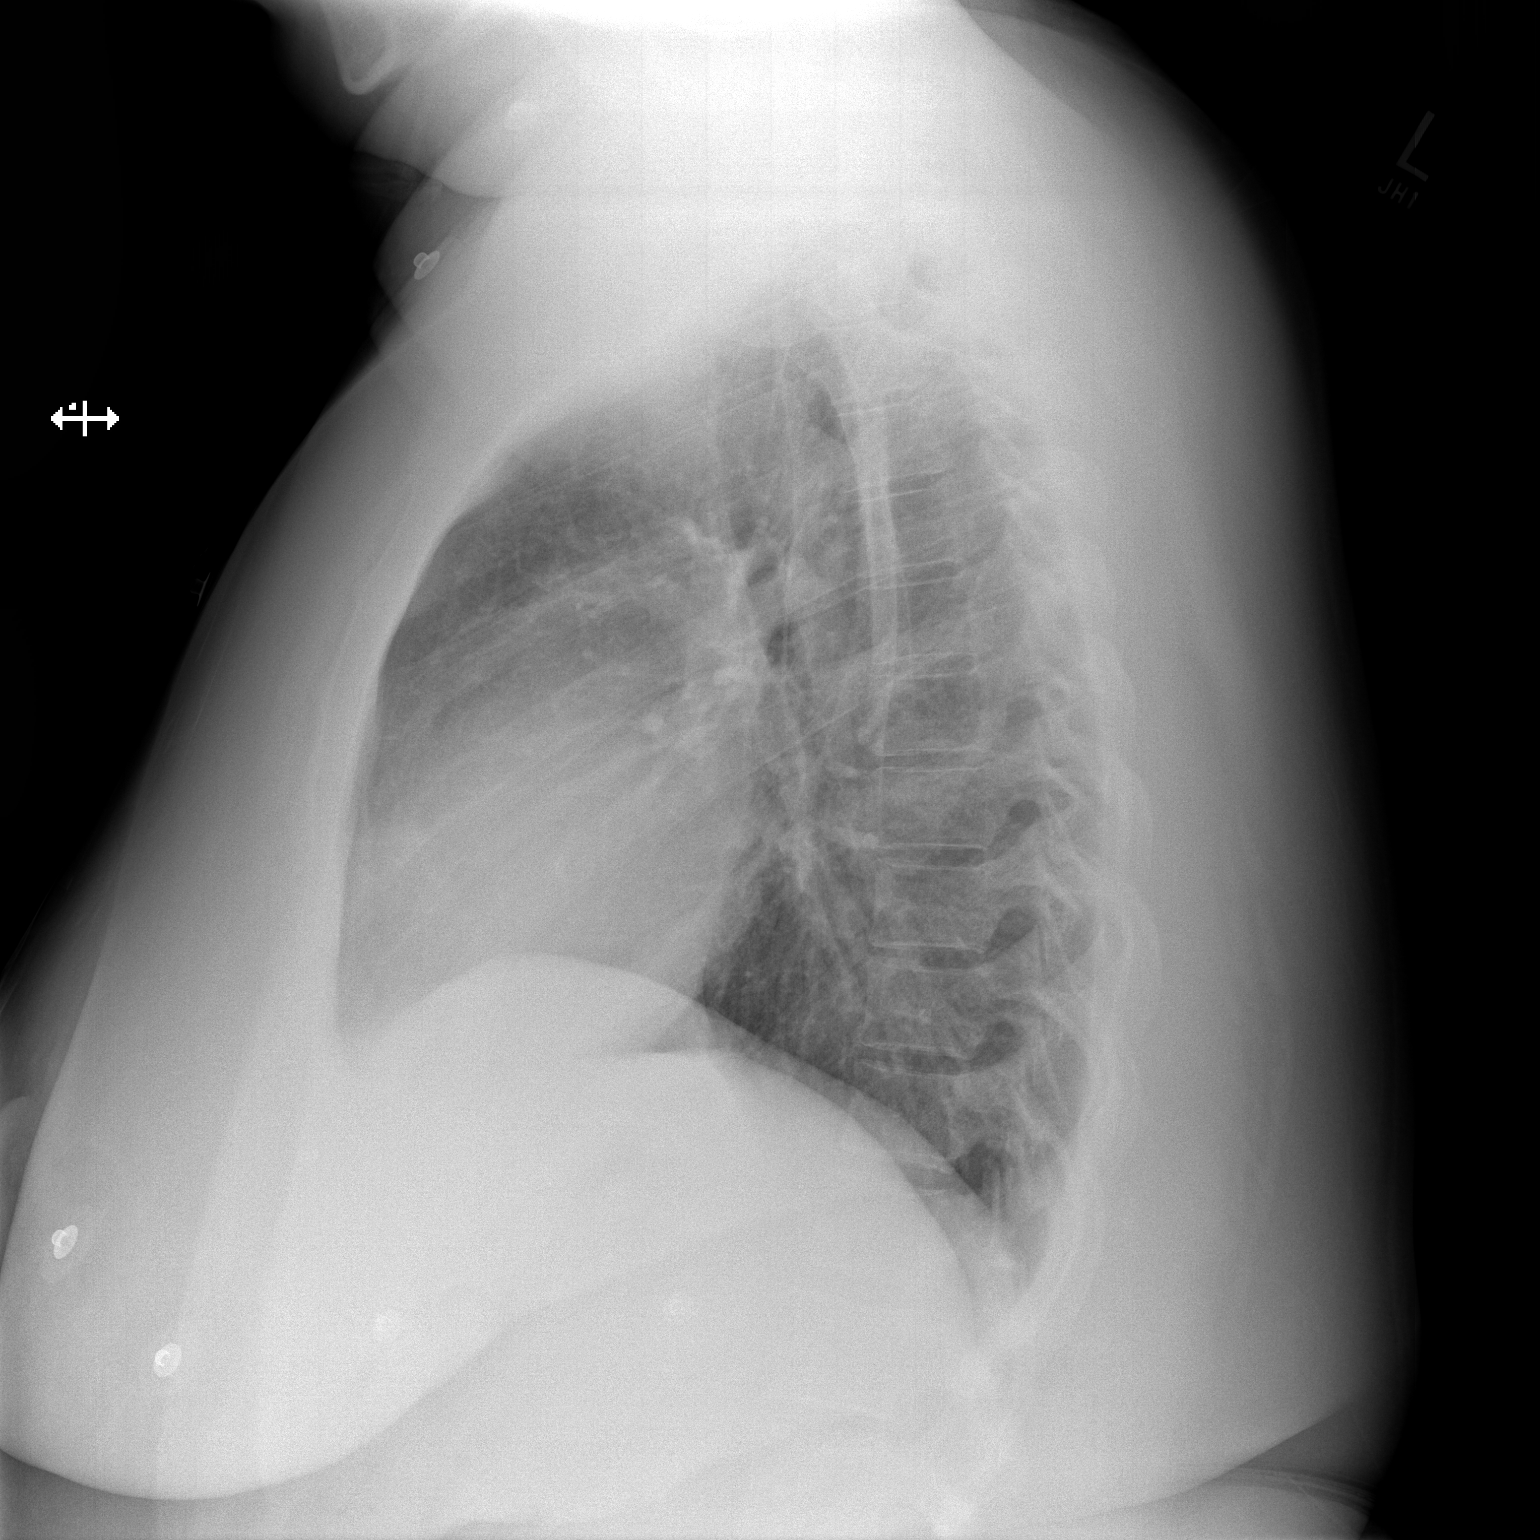

[2 of 2 positions shown; findings below may reference images not displayed]

FINDINGS: The heart size and mediastinal contours are within normal limits.
Both lungs are clear. The visualized skeletal structures are
unremarkable.
IMPRESSION: No active cardiopulmonary disease.

## 2021-08-11 ENCOUNTER — Ambulatory Visit: Payer: BC Managed Care – PPO | Admitting: Cardiovascular Disease

## 2021-08-21 DIAGNOSIS — I4729 Other ventricular tachycardia: Secondary | ICD-10-CM | POA: Diagnosis not present

## 2021-08-21 DIAGNOSIS — I10 Essential (primary) hypertension: Secondary | ICD-10-CM | POA: Diagnosis not present

## 2021-08-21 DIAGNOSIS — R002 Palpitations: Secondary | ICD-10-CM | POA: Diagnosis not present

## 2021-08-22 DIAGNOSIS — R002 Palpitations: Secondary | ICD-10-CM | POA: Diagnosis not present

## 2021-08-22 DIAGNOSIS — I4729 Other ventricular tachycardia: Secondary | ICD-10-CM | POA: Diagnosis not present

## 2021-08-22 DIAGNOSIS — I10 Essential (primary) hypertension: Secondary | ICD-10-CM | POA: Diagnosis not present

## 2021-08-28 DIAGNOSIS — F411 Generalized anxiety disorder: Secondary | ICD-10-CM | POA: Diagnosis not present

## 2021-09-04 DIAGNOSIS — R002 Palpitations: Secondary | ICD-10-CM | POA: Diagnosis not present

## 2021-09-04 DIAGNOSIS — I4729 Other ventricular tachycardia: Secondary | ICD-10-CM | POA: Diagnosis not present

## 2021-09-04 DIAGNOSIS — I10 Essential (primary) hypertension: Secondary | ICD-10-CM | POA: Diagnosis not present

## 2021-09-05 DIAGNOSIS — F411 Generalized anxiety disorder: Secondary | ICD-10-CM | POA: Diagnosis not present

## 2021-09-18 DIAGNOSIS — F411 Generalized anxiety disorder: Secondary | ICD-10-CM | POA: Diagnosis not present

## 2021-09-22 DIAGNOSIS — F411 Generalized anxiety disorder: Secondary | ICD-10-CM | POA: Diagnosis not present

## 2021-10-06 ENCOUNTER — Telehealth: Payer: Self-pay | Admitting: Cardiovascular Disease

## 2021-10-06 NOTE — Telephone Encounter (Signed)
Patient states she s now seeing another cardiologist office

## 2021-10-23 DIAGNOSIS — R002 Palpitations: Secondary | ICD-10-CM | POA: Diagnosis not present

## 2021-10-23 DIAGNOSIS — I10 Essential (primary) hypertension: Secondary | ICD-10-CM | POA: Diagnosis not present

## 2021-10-23 DIAGNOSIS — I4729 Other ventricular tachycardia: Secondary | ICD-10-CM | POA: Diagnosis not present

## 2021-11-06 DIAGNOSIS — I4729 Other ventricular tachycardia: Secondary | ICD-10-CM | POA: Diagnosis not present

## 2021-11-06 DIAGNOSIS — I10 Essential (primary) hypertension: Secondary | ICD-10-CM | POA: Diagnosis not present

## 2021-11-06 DIAGNOSIS — R002 Palpitations: Secondary | ICD-10-CM | POA: Diagnosis not present

## 2021-11-11 DIAGNOSIS — F411 Generalized anxiety disorder: Secondary | ICD-10-CM | POA: Diagnosis not present

## 2021-11-17 DIAGNOSIS — B353 Tinea pedis: Secondary | ICD-10-CM | POA: Diagnosis not present

## 2021-11-17 DIAGNOSIS — I4729 Other ventricular tachycardia: Secondary | ICD-10-CM | POA: Diagnosis not present

## 2021-11-17 DIAGNOSIS — I10 Essential (primary) hypertension: Secondary | ICD-10-CM | POA: Diagnosis not present

## 2021-11-25 DIAGNOSIS — E559 Vitamin D deficiency, unspecified: Secondary | ICD-10-CM | POA: Diagnosis not present

## 2021-11-25 DIAGNOSIS — Z131 Encounter for screening for diabetes mellitus: Secondary | ICD-10-CM | POA: Diagnosis not present

## 2021-11-25 DIAGNOSIS — Z6841 Body Mass Index (BMI) 40.0 and over, adult: Secondary | ICD-10-CM | POA: Diagnosis not present

## 2021-11-25 DIAGNOSIS — I10 Essential (primary) hypertension: Secondary | ICD-10-CM | POA: Diagnosis not present

## 2021-11-25 DIAGNOSIS — E785 Hyperlipidemia, unspecified: Secondary | ICD-10-CM | POA: Diagnosis not present

## 2021-11-26 DIAGNOSIS — E559 Vitamin D deficiency, unspecified: Secondary | ICD-10-CM | POA: Insufficient documentation

## 2021-11-28 DIAGNOSIS — F411 Generalized anxiety disorder: Secondary | ICD-10-CM | POA: Diagnosis not present

## 2021-12-08 DIAGNOSIS — F411 Generalized anxiety disorder: Secondary | ICD-10-CM | POA: Diagnosis not present

## 2021-12-22 DIAGNOSIS — N8189 Other female genital prolapse: Secondary | ICD-10-CM | POA: Diagnosis not present

## 2021-12-22 DIAGNOSIS — I10 Essential (primary) hypertension: Secondary | ICD-10-CM | POA: Diagnosis not present

## 2021-12-22 DIAGNOSIS — N3941 Urge incontinence: Secondary | ICD-10-CM | POA: Diagnosis not present

## 2021-12-22 DIAGNOSIS — N920 Excessive and frequent menstruation with regular cycle: Secondary | ICD-10-CM | POA: Diagnosis not present

## 2021-12-31 DIAGNOSIS — N920 Excessive and frequent menstruation with regular cycle: Secondary | ICD-10-CM | POA: Diagnosis not present

## 2021-12-31 DIAGNOSIS — N939 Abnormal uterine and vaginal bleeding, unspecified: Secondary | ICD-10-CM | POA: Diagnosis not present

## 2021-12-31 DIAGNOSIS — R030 Elevated blood-pressure reading, without diagnosis of hypertension: Secondary | ICD-10-CM | POA: Diagnosis not present

## 2021-12-31 DIAGNOSIS — Z6841 Body Mass Index (BMI) 40.0 and over, adult: Secondary | ICD-10-CM | POA: Diagnosis not present

## 2021-12-31 DIAGNOSIS — N946 Dysmenorrhea, unspecified: Secondary | ICD-10-CM | POA: Diagnosis not present

## 2022-01-01 DIAGNOSIS — E559 Vitamin D deficiency, unspecified: Secondary | ICD-10-CM | POA: Diagnosis not present

## 2022-01-01 DIAGNOSIS — E8889 Other specified metabolic disorders: Secondary | ICD-10-CM | POA: Diagnosis not present

## 2022-01-01 DIAGNOSIS — I10 Essential (primary) hypertension: Secondary | ICD-10-CM | POA: Diagnosis not present

## 2022-01-01 DIAGNOSIS — R5383 Other fatigue: Secondary | ICD-10-CM | POA: Diagnosis not present

## 2022-01-01 DIAGNOSIS — Z1331 Encounter for screening for depression: Secondary | ICD-10-CM | POA: Diagnosis not present

## 2022-01-02 DIAGNOSIS — I10 Essential (primary) hypertension: Secondary | ICD-10-CM | POA: Diagnosis not present

## 2022-01-02 DIAGNOSIS — R002 Palpitations: Secondary | ICD-10-CM | POA: Diagnosis not present

## 2022-01-08 DIAGNOSIS — R002 Palpitations: Secondary | ICD-10-CM | POA: Diagnosis not present

## 2022-01-08 DIAGNOSIS — I10 Essential (primary) hypertension: Secondary | ICD-10-CM | POA: Diagnosis not present

## 2022-01-08 DIAGNOSIS — I493 Ventricular premature depolarization: Secondary | ICD-10-CM | POA: Diagnosis not present

## 2022-01-15 DIAGNOSIS — Z6841 Body Mass Index (BMI) 40.0 and over, adult: Secondary | ICD-10-CM | POA: Diagnosis not present

## 2022-01-15 DIAGNOSIS — E559 Vitamin D deficiency, unspecified: Secondary | ICD-10-CM | POA: Diagnosis not present

## 2022-01-15 DIAGNOSIS — I10 Essential (primary) hypertension: Secondary | ICD-10-CM | POA: Diagnosis not present

## 2022-01-15 DIAGNOSIS — E669 Obesity, unspecified: Secondary | ICD-10-CM | POA: Diagnosis not present

## 2022-02-19 DIAGNOSIS — I10 Essential (primary) hypertension: Secondary | ICD-10-CM | POA: Diagnosis not present

## 2022-02-19 DIAGNOSIS — Z0000 Encounter for general adult medical examination without abnormal findings: Secondary | ICD-10-CM | POA: Diagnosis not present

## 2022-02-19 DIAGNOSIS — B351 Tinea unguium: Secondary | ICD-10-CM | POA: Insufficient documentation

## 2022-02-19 DIAGNOSIS — B353 Tinea pedis: Secondary | ICD-10-CM | POA: Diagnosis not present

## 2022-03-11 DIAGNOSIS — I10 Essential (primary) hypertension: Secondary | ICD-10-CM | POA: Diagnosis not present

## 2022-03-11 DIAGNOSIS — E559 Vitamin D deficiency, unspecified: Secondary | ICD-10-CM | POA: Diagnosis not present

## 2022-03-11 DIAGNOSIS — R7309 Other abnormal glucose: Secondary | ICD-10-CM | POA: Diagnosis not present

## 2022-03-11 DIAGNOSIS — E669 Obesity, unspecified: Secondary | ICD-10-CM | POA: Diagnosis not present

## 2022-03-18 DIAGNOSIS — R059 Cough, unspecified: Secondary | ICD-10-CM | POA: Diagnosis not present

## 2022-03-18 DIAGNOSIS — R0981 Nasal congestion: Secondary | ICD-10-CM | POA: Diagnosis not present

## 2022-03-18 DIAGNOSIS — I10 Essential (primary) hypertension: Secondary | ICD-10-CM | POA: Diagnosis not present

## 2022-03-26 DIAGNOSIS — N8110 Cystocele, unspecified: Secondary | ICD-10-CM | POA: Diagnosis not present

## 2022-03-26 DIAGNOSIS — N3281 Overactive bladder: Secondary | ICD-10-CM | POA: Diagnosis not present

## 2022-03-26 DIAGNOSIS — R399 Unspecified symptoms and signs involving the genitourinary system: Secondary | ICD-10-CM | POA: Diagnosis not present

## 2022-03-26 DIAGNOSIS — I10 Essential (primary) hypertension: Secondary | ICD-10-CM | POA: Diagnosis not present

## 2022-04-02 DIAGNOSIS — I10 Essential (primary) hypertension: Secondary | ICD-10-CM | POA: Diagnosis not present

## 2022-04-02 DIAGNOSIS — R7303 Prediabetes: Secondary | ICD-10-CM | POA: Diagnosis not present

## 2022-05-04 DIAGNOSIS — I10 Essential (primary) hypertension: Secondary | ICD-10-CM | POA: Diagnosis not present

## 2022-05-04 DIAGNOSIS — E559 Vitamin D deficiency, unspecified: Secondary | ICD-10-CM | POA: Diagnosis not present

## 2022-05-04 DIAGNOSIS — E669 Obesity, unspecified: Secondary | ICD-10-CM | POA: Diagnosis not present

## 2022-05-04 DIAGNOSIS — R7303 Prediabetes: Secondary | ICD-10-CM | POA: Diagnosis not present

## 2022-05-21 DIAGNOSIS — F411 Generalized anxiety disorder: Secondary | ICD-10-CM | POA: Diagnosis not present

## 2022-05-28 DIAGNOSIS — F411 Generalized anxiety disorder: Secondary | ICD-10-CM | POA: Diagnosis not present

## 2022-06-04 DIAGNOSIS — R7303 Prediabetes: Secondary | ICD-10-CM | POA: Diagnosis not present

## 2022-06-04 DIAGNOSIS — E559 Vitamin D deficiency, unspecified: Secondary | ICD-10-CM | POA: Diagnosis not present

## 2022-06-04 DIAGNOSIS — I10 Essential (primary) hypertension: Secondary | ICD-10-CM | POA: Diagnosis not present

## 2022-06-04 DIAGNOSIS — E669 Obesity, unspecified: Secondary | ICD-10-CM | POA: Diagnosis not present

## 2022-06-10 DIAGNOSIS — Z124 Encounter for screening for malignant neoplasm of cervix: Secondary | ICD-10-CM | POA: Diagnosis not present

## 2022-06-10 DIAGNOSIS — Z113 Encounter for screening for infections with a predominantly sexual mode of transmission: Secondary | ICD-10-CM | POA: Diagnosis not present

## 2022-06-10 DIAGNOSIS — N920 Excessive and frequent menstruation with regular cycle: Secondary | ICD-10-CM | POA: Diagnosis not present

## 2022-06-10 DIAGNOSIS — Z01419 Encounter for gynecological examination (general) (routine) without abnormal findings: Secondary | ICD-10-CM | POA: Diagnosis not present

## 2022-06-25 DIAGNOSIS — I10 Essential (primary) hypertension: Secondary | ICD-10-CM | POA: Diagnosis not present

## 2022-06-25 DIAGNOSIS — D72829 Elevated white blood cell count, unspecified: Secondary | ICD-10-CM | POA: Diagnosis not present

## 2022-06-25 DIAGNOSIS — Z1330 Encounter for screening examination for mental health and behavioral disorders, unspecified: Secondary | ICD-10-CM | POA: Diagnosis not present

## 2022-06-25 DIAGNOSIS — I4729 Other ventricular tachycardia: Secondary | ICD-10-CM | POA: Diagnosis not present

## 2022-07-01 DIAGNOSIS — I10 Essential (primary) hypertension: Secondary | ICD-10-CM | POA: Diagnosis not present

## 2022-07-01 DIAGNOSIS — R7303 Prediabetes: Secondary | ICD-10-CM | POA: Diagnosis not present

## 2022-07-01 DIAGNOSIS — E669 Obesity, unspecified: Secondary | ICD-10-CM | POA: Diagnosis not present

## 2022-07-01 DIAGNOSIS — E559 Vitamin D deficiency, unspecified: Secondary | ICD-10-CM | POA: Diagnosis not present

## 2022-07-03 ENCOUNTER — Ambulatory Visit
Admission: EM | Admit: 2022-07-03 | Discharge: 2022-07-03 | Disposition: A | Discharge status: Home / Self Care | Payer: BC Managed Care – PPO | Attending: Nurse Practitioner | Admitting: Nurse Practitioner

## 2022-07-03 DIAGNOSIS — I10 Essential (primary) hypertension: Secondary | ICD-10-CM

## 2022-07-03 DIAGNOSIS — R002 Palpitations: Secondary | ICD-10-CM

## 2022-07-03 NOTE — ED Provider Notes (Signed)
[facility]    CSN: [id number] Arrival date & time: 07/03/22  1815      History   Chief Complaint Chief Complaint  Patient presents with  . Blood Pressure Check    HPI Sarah Giles is a 32 y.o. female presents for evaluation of a blood pressure check.  Patient has a history of hypertension and is currently on hydrochlorothiazide and metoprolol.  She does take her blood pressure at home and states it has been a little bit higher than normal recently.  Denies any headache, dizziness, visual changes, syncope.  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  She does state before her menses she typically has more palpitations but it has been longer than typical recently.  She did capture 1 on her Apple Watch and send it to her cardiologist who said it was a single PVC and advised to continue her metoprolol.  She denies any chest pain, shortness of breath.  Has an appointment with her cardiologist in 6 days.  No other concerns at this time.  HPI  Past Medical History:  Diagnosis Date  . Allergy   . Gestational hypertension 04/27/2014  . Hypertension   . Morbid obesity ([place]) 04/27/2014  . NSVD (normal spontaneous vaginal delivery) 04/28/2014    Patient Active Problem List   Diagnosis Date Noted  . SVD (6/8) 10/03/2020  . Chronic hypertension 10/01/2020  . Encounter for planned induction of labor 09/30/2020  . Chronic hypertension with exacerbation during pregnancy in third trimester 03/06/2019  . Palpitations 11/08/2018  . Morbid obesity ([place]) 04/27/2014    Past Surgical History:  Procedure Laterality Date  . BREAST SURGERY     2008    OB History     Gravida  3   Para  3   Term  3   Preterm      AB      Living  3      SAB      IAB      Ectopic      Multiple  0   Live Births  3            Home Medications    Prior to Admission medications   Medication Sig Start Date End Date Taking? Authorizing Provider  acetaminophen (TYLENOL)  325 MG tablet Take 2 tablets (650 mg total) by mouth every 4 (four) hours as needed (for pain scale < 4). 10/03/20   Arrie Eastern, CNM  ibuprofen (ADVIL) 600 MG tablet Take 1 tablet (600 mg total) by mouth every 6 (six) hours. 10/03/20   Arrie Eastern, CNM  labetalol (NORMODYNE) 200 MG tablet Take 1 tablet (200 mg total) by mouth 2 (two) times daily. 10/03/20 11/02/20  Arrie Eastern, CNM  loratadine (CLARITIN) 10 MG tablet Take 10 mg by mouth daily.    [provider]  potassium chloride (KLOR-CON) 10 MEQ tablet Take 1 tablet (10 mEq total) by mouth daily. 08/07/19   Nahser, Wonda Cheng, MD    Family History Family History  Problem Relation Age of Onset  . Hypertension Mother   . Diabetes Father   . Hypertension Maternal Grandmother   . Diabetes Paternal Grandfather     Social History Social History   Tobacco Use  . Smoking status: Never  . Smokeless tobacco: Never  Vaping Use  . Vaping Use: Never used  Substance Use Topics  . Alcohol use: Not Currently    Alcohol/week: 3.0 standard drinks  of alcohol    Types: 3 Standard drinks or equivalent per week  . Drug use: Never     Allergies   Hydroxyzine, Amlodipine, and Contrast media [iodinated contrast media]   Review of Systems Review of Systems  Cardiovascular:  Positive for palpitations.       BP check      Physical Exam Triage Vital Signs ED Triage Vitals  Enc Vitals Group     BP 07/03/22 1833 (!) 143/94     Pulse Rate 07/03/22 1832 100     Resp 07/03/22 1832 17     Temp 07/03/22 1833 97.9 F (36.6 C)     Temp Source 07/03/22 1833 Oral     SpO2 07/03/22 1832 98 %     Weight --      Height --      Head Circumference --      Peak Flow --      Pain Score 07/03/22 1831 0     Pain Loc --      Pain Edu? --      Excl. in [place]? --    No data found.  Updated Vital Signs BP (!) 143/94 (BP Location: Right Arm)   Pulse 100   Temp 97.9 F (36.6 C) (Oral)   Resp 17   LMP 06/09/2022 (Exact Date)   SpO2 98%    Visual Acuity Right Eye Distance:   Left Eye Distance:   Bilateral Distance:    Right Eye Near:   Left Eye Near:    Bilateral Near:     Physical Exam Vitals and nursing note reviewed.  Constitutional:      General: She is not in acute distress.    Appearance: Normal appearance. She is not ill-appearing.  HENT:     Head: Normocephalic and atraumatic.  Eyes:     Pupils: Pupils are equal, round, and reactive to light.  Cardiovascular:     Rate and Rhythm: Normal rate and regular rhythm.     Heart sounds: Normal heart sounds. No murmur heard. Pulmonary:     Effort: Pulmonary effort is normal.     Breath sounds: Normal breath sounds.  Skin:    General: Skin is warm and dry.  Neurological:     General: No focal deficit present.     Mental Status: She is alert and oriented to person, place, and time.  Psychiatric:        Mood and Affect: Mood normal.        Behavior: Behavior normal.      UC Treatments / Results  Labs (all labs ordered are listed, but only abnormal results are displayed) Labs Reviewed - No data to display  EKG   Radiology No results found.  Procedures ED EKG  Date/Time: 07/03/2022 6:49 PM  Performed by: Melynda Ripple, NP Authorized by: Melynda Ripple, NP   Interpretation:    Interpretation: normal   Rate:    ECG rate:  93   ECG rate assessment: normal   Rhythm:    Rhythm: sinus rhythm   Ectopy:    Ectopy: none   ST segments:    ST segments:  Normal T waves:    T waves: normal    (including critical care time)  Medications Ordered in UC Medications - No data to display  Initial Impression / Assessment and Plan / UC Course  I have reviewed the triage vital signs and the nursing notes.  Pertinent labs & imaging results that were  available during my care of the patient were reviewed by me and considered in my medical decision making (see chart for details).  Clinical Course as of 07/03/22 1858  Fri Jul 03, 2022  1857 BP recheck  manually 130/80 [JM]    Clinical Course User Index [JM] Melynda Ripple, NP    EKG unremarkable, no ectopy BP recheck 130/80.  Discussed with patient Advised to continue to monitor BP and palpitations and follow-up with the electrophysiologist at her next appointment next week ER precautions reviewed and patient verbalized understanding Final Clinical Impressions(s) / UC Diagnoses   Final diagnoses:  Palpitations  Chronic hypertension     Discharge Instructions      Continue to monitor your symptoms and check your blood pressure at home and follow-up with your cardiologist and PCP     ED Prescriptions   None    PDMP not reviewed this encounter.   Melynda Ripple, NP 07/03/22 [phone]

## 2022-07-03 NOTE — Discharge Instructions (Signed)
Continue to monitor your symptoms and check your blood pressure at home and follow-up with your cardiologist and PCP

## 2022-07-03 NOTE — ED Triage Notes (Signed)
Pt presents for a blood pressure check. States she had some recent medication changes and reports her BP has been off.

## 2022-07-03 NOTE — ED Triage Notes (Signed)
Pt reports she has noticed some PVC'S

## 2022-07-09 DIAGNOSIS — I10 Essential (primary) hypertension: Secondary | ICD-10-CM | POA: Diagnosis not present

## 2022-07-09 DIAGNOSIS — R002 Palpitations: Secondary | ICD-10-CM | POA: Diagnosis not present

## 2022-07-09 DIAGNOSIS — I493 Ventricular premature depolarization: Secondary | ICD-10-CM | POA: Diagnosis not present

## 2022-07-30 DIAGNOSIS — I10 Essential (primary) hypertension: Secondary | ICD-10-CM | POA: Diagnosis not present

## 2022-07-30 DIAGNOSIS — R7303 Prediabetes: Secondary | ICD-10-CM | POA: Diagnosis not present

## 2022-07-30 DIAGNOSIS — E669 Obesity, unspecified: Secondary | ICD-10-CM | POA: Diagnosis not present

## 2022-07-30 DIAGNOSIS — E559 Vitamin D deficiency, unspecified: Secondary | ICD-10-CM | POA: Diagnosis not present

## 2022-08-19 DIAGNOSIS — D72829 Elevated white blood cell count, unspecified: Secondary | ICD-10-CM | POA: Diagnosis not present

## 2022-08-19 DIAGNOSIS — I10 Essential (primary) hypertension: Secondary | ICD-10-CM | POA: Diagnosis not present

## 2022-09-02 DIAGNOSIS — E559 Vitamin D deficiency, unspecified: Secondary | ICD-10-CM | POA: Diagnosis not present

## 2022-09-02 DIAGNOSIS — E669 Obesity, unspecified: Secondary | ICD-10-CM | POA: Diagnosis not present

## 2022-09-02 DIAGNOSIS — R7303 Prediabetes: Secondary | ICD-10-CM | POA: Diagnosis not present

## 2022-09-02 DIAGNOSIS — I10 Essential (primary) hypertension: Secondary | ICD-10-CM | POA: Diagnosis not present

## 2022-09-30 DIAGNOSIS — I10 Essential (primary) hypertension: Secondary | ICD-10-CM | POA: Diagnosis not present

## 2022-09-30 DIAGNOSIS — E669 Obesity, unspecified: Secondary | ICD-10-CM | POA: Diagnosis not present

## 2022-09-30 DIAGNOSIS — R7303 Prediabetes: Secondary | ICD-10-CM | POA: Diagnosis not present

## 2022-09-30 DIAGNOSIS — E559 Vitamin D deficiency, unspecified: Secondary | ICD-10-CM | POA: Diagnosis not present

## 2022-11-10 DIAGNOSIS — R7303 Prediabetes: Secondary | ICD-10-CM | POA: Diagnosis not present

## 2022-11-10 DIAGNOSIS — I10 Essential (primary) hypertension: Secondary | ICD-10-CM | POA: Diagnosis not present

## 2022-11-10 DIAGNOSIS — E559 Vitamin D deficiency, unspecified: Secondary | ICD-10-CM | POA: Diagnosis not present

## 2022-11-10 DIAGNOSIS — E669 Obesity, unspecified: Secondary | ICD-10-CM | POA: Diagnosis not present

## 2022-12-15 DIAGNOSIS — R7303 Prediabetes: Secondary | ICD-10-CM | POA: Diagnosis not present

## 2022-12-15 DIAGNOSIS — I10 Essential (primary) hypertension: Secondary | ICD-10-CM | POA: Diagnosis not present

## 2022-12-15 DIAGNOSIS — E669 Obesity, unspecified: Secondary | ICD-10-CM | POA: Diagnosis not present

## 2022-12-15 DIAGNOSIS — E559 Vitamin D deficiency, unspecified: Secondary | ICD-10-CM | POA: Diagnosis not present

## 2023-01-14 DIAGNOSIS — I10 Essential (primary) hypertension: Secondary | ICD-10-CM | POA: Diagnosis not present

## 2023-01-14 DIAGNOSIS — E559 Vitamin D deficiency, unspecified: Secondary | ICD-10-CM | POA: Diagnosis not present

## 2023-01-14 DIAGNOSIS — R7303 Prediabetes: Secondary | ICD-10-CM | POA: Diagnosis not present

## 2023-01-14 DIAGNOSIS — K5903 Drug induced constipation: Secondary | ICD-10-CM | POA: Diagnosis not present

## 2023-01-19 DIAGNOSIS — F4322 Adjustment disorder with anxiety: Secondary | ICD-10-CM | POA: Diagnosis not present

## 2023-01-27 DIAGNOSIS — Z23 Encounter for immunization: Secondary | ICD-10-CM | POA: Diagnosis not present

## 2023-02-02 DIAGNOSIS — F4322 Adjustment disorder with anxiety: Secondary | ICD-10-CM | POA: Diagnosis not present

## 2023-02-09 DIAGNOSIS — K5903 Drug induced constipation: Secondary | ICD-10-CM | POA: Diagnosis not present

## 2023-02-09 DIAGNOSIS — I10 Essential (primary) hypertension: Secondary | ICD-10-CM | POA: Diagnosis not present

## 2023-02-09 DIAGNOSIS — E559 Vitamin D deficiency, unspecified: Secondary | ICD-10-CM | POA: Diagnosis not present

## 2023-02-09 DIAGNOSIS — R7303 Prediabetes: Secondary | ICD-10-CM | POA: Diagnosis not present

## 2023-02-18 DIAGNOSIS — Z91018 Allergy to other foods: Secondary | ICD-10-CM | POA: Insufficient documentation

## 2023-02-18 DIAGNOSIS — R7303 Prediabetes: Secondary | ICD-10-CM | POA: Diagnosis not present

## 2023-02-18 DIAGNOSIS — Z0000 Encounter for general adult medical examination without abnormal findings: Secondary | ICD-10-CM | POA: Diagnosis not present

## 2023-02-18 DIAGNOSIS — E559 Vitamin D deficiency, unspecified: Secondary | ICD-10-CM | POA: Diagnosis not present

## 2023-02-18 DIAGNOSIS — L700 Acne vulgaris: Secondary | ICD-10-CM | POA: Insufficient documentation

## 2023-02-18 DIAGNOSIS — I10 Essential (primary) hypertension: Secondary | ICD-10-CM | POA: Diagnosis not present

## 2023-02-18 DIAGNOSIS — N920 Excessive and frequent menstruation with regular cycle: Secondary | ICD-10-CM | POA: Insufficient documentation

## 2023-03-08 ENCOUNTER — Encounter: Payer: Self-pay | Admitting: Dermatology

## 2023-03-08 ENCOUNTER — Ambulatory Visit: Payer: BC Managed Care – PPO | Admitting: Dermatology

## 2023-03-08 VITALS — BP 145/99 | HR 81

## 2023-03-08 DIAGNOSIS — L810 Postinflammatory hyperpigmentation: Secondary | ICD-10-CM | POA: Diagnosis not present

## 2023-03-08 DIAGNOSIS — O4440 Low lying placenta NOS or without hemorrhage, unspecified trimester: Secondary | ICD-10-CM | POA: Insufficient documentation

## 2023-03-08 DIAGNOSIS — L700 Acne vulgaris: Secondary | ICD-10-CM

## 2023-03-08 DIAGNOSIS — Z1389 Encounter for screening for other disorder: Secondary | ICD-10-CM

## 2023-03-08 DIAGNOSIS — H539 Unspecified visual disturbance: Secondary | ICD-10-CM | POA: Insufficient documentation

## 2023-03-08 DIAGNOSIS — N760 Acute vaginitis: Secondary | ICD-10-CM | POA: Insufficient documentation

## 2023-03-08 DIAGNOSIS — L709 Acne, unspecified: Secondary | ICD-10-CM

## 2023-03-08 DIAGNOSIS — A498 Other bacterial infections of unspecified site: Secondary | ICD-10-CM | POA: Insufficient documentation

## 2023-03-08 DIAGNOSIS — O26849 Uterine size-date discrepancy, unspecified trimester: Secondary | ICD-10-CM | POA: Insufficient documentation

## 2023-03-08 DIAGNOSIS — R030 Elevated blood-pressure reading, without diagnosis of hypertension: Secondary | ICD-10-CM | POA: Insufficient documentation

## 2023-03-08 MED ORDER — CLINDAMYCIN PHOSPHATE 1 % EX SWAB: 1.0000 | Freq: Every evening | CUTANEOUS | 4 refills | Status: DC

## 2023-03-08 MED ORDER — CLINDAMYCIN PHOSPHATE 1 % EX SWAB: 1.0000 | Freq: Every morning | CUTANEOUS | 4 refills | Status: DC

## 2023-03-08 MED ORDER — SAFETY SEAL MISCELLANEOUS MISC: 1.0000 | Freq: Every evening | 3 refills | Status: DC

## 2023-03-08 MED ORDER — SPIRONOLACTONE 100 MG PO TABS: 100.0000 mg | ORAL_TABLET | Freq: Every evening | ORAL | 4 refills | Status: DC

## 2023-03-08 MED ORDER — ARAZLO 0.045 % EX LOTN: 1.0000 | TOPICAL_LOTION | Freq: Every evening | CUTANEOUS | 4 refills | Status: DC

## 2023-03-08 NOTE — Progress Notes (Signed)
New Patient Visit   Subjective  Sarah Giles is a 32 y.o. female who presents for the following: Acne  Patient states she has acne located at the face that she would like to have examined. Patient reports the areas have been there for 5 months. She reports the areas are bothersome. Patient rates irritation 8 out of 10. She states that the areas have not spread. Patient reports she has not previously been treated for these areas. She reports her currently she cleanses with La Roche Posay Tolerine Hydrating wash in the am and the Toulerine foaming at night, Depending on the severity of the breakout she will use Panoxyl or [employer]. She then follows up with Hyluronic acid and Neutrogena sunscreen in the am, and at night Niacinamide toner. She then uses the Skin Fix triple peptide skin fix moisturizer. She has only been on this regimen for 1 week. She states she has noticed having more flares around the time of her menstrual cycles.  The following portions of the chart were reviewed this encounter and updated as appropriate: medications, allergies, medical history  Review of Systems:  No other skin or systemic complaints except as noted in HPI or Assessment and Plan.  Objective  Well appearing patient in no apparent distress; mood and affect are within normal limits.  A focused examination was performed of the following areas: Face  Relevant exam findings are noted in the Assessment and Plan.            Assessment & Plan   ACNE VULGARIS Exam: Open comedones and inflammatory papules  Flared  Treatment Plan: - Recommended increasing water intakes - Recommended washing pillow cases weekly, changing and cleaning make-up brushes and changing older make up - We will plan to check potassium levels to ensure safe Potassium levels prior to beginning Spironolactone - We will plan to send in spironolactone to have on hand to start  - We will prescribe Arazlo to apply to the face 3 nights  weekly  - Recommended applying a daily moisturizer with sunscreen to prevent acne scarring from getting worse  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 / Expectations: Post Inflammatory pigmentary change is lighter or darker discoloration than surrounding skin resulting from trauma or rashes. Areas tend to normalize over time, but can take months to years.  Treatment Plan: - We will prescribe Melaxemic Cream (Tranexamic Acid 5%, Kojic Acid 2%, Vit C 2.5%, Hyaluronic Acid 0.1%) sent to [facility] - Once skin is acclimated to retinoid we will add in a separate topical lightening agent - Recommended broad spectrum SPF30 every day, hats and sun avoidence - We will plan to follow up in 3 months   Acne, unspecified acne type  Related Medications spironolactone (ALDACTONE) 100 MG tablet Take 1 tablet (100 mg total) by mouth at bedtime.  clindamycin (CLEOCIN T) 1 % SWAB Apply 1 Application topically at bedtime.  Post-inflammatory hyperpigmentation  Related Medications Safety Seal Miscellaneous MISC Apply 1 Application topically at bedtime. Medication Name: Tranexamic Acid 5%, Kojic Acid 2%, Vit C 2.5%, Hyaluronic Acid 0.1%  Screening for skin condition  Related Procedures Potassium  Return in about 2 weeks (around 03/22/2023) for Acne F/U.   Documentation: I have reviewed the above documentation for accuracy and completeness, and I agree with the above.  I, Nell Range, am  acting as scribe for Langston Reusing, DO.  Langston Reusing, DO

## 2023-03-08 NOTE — Patient Instructions (Addendum)
Hello Sarah Giles,  Thank you for visiting my office today. Your dedication to enhancing your skin health is greatly appreciated. Below is a summary of the essential instructions we covered during our consultation:  - Hydration: Increase water intake to support overall skin health.  - Medications Prescribed:   - Spironolactone: To be taken with dinner. This medication is aimed at preventing new acne flares that are associated with your menstrual cycle. Monitoring of your potassium levels will occur before initiation and again in three months.   - Clindamycin Swabs: Use in the morning after washing your face to help combat acne.   - Arazlo (Sarah Giles): Apply a pea-sized amount at night on Monday, Wednesday, and Friday. This is for acne treatment and skin renewal.   - Prescription Lightening Cream: Use on the nights when Arazlo is not applied. This cream is intended to help with dark spots.  - Sun Protection: Sarah Giles Toleriane Double Repair with UV is recommended as a morning moisturizer with sunscreen to protect your skin from UV damage.  - Follow-Up: A follow-up appointment is scheduled in three months to assess your progress and adjust the treatment plan if necessary.  Please ensure to adhere closely to these instructions. Should you have any questions or concerns before our next meeting, do not hesitate to reach out to the office.  Warm regards,  Dr. [facility]     Recommended Sunscreen:     Important Information   Due to recent changes in healthcare laws, you may see results of your pathology and/or laboratory studies on MyChart before the doctors have had a chance to review them. We understand that in some cases there may be results that are confusing or concerning to you. Please understand that not all results are received at the same time and often the doctors may need to interpret multiple results in order to provide you with the best plan of care or course of  treatment. Therefore, we ask that you please give [place] 2 business days to thoroughly review all your results before contacting the office for clarification. Should we see a critical lab result, you will be contacted sooner.     If You Need Anything After Your Visit   If you have any questions or concerns for your doctor, please call our main line at [phone]. If no one answers, please leave a voicemail as directed and we will return your call as soon as possible. Messages left after 4 pm will be answered the following business day.    You may also send [place] a message via MyChart. We typically respond to MyChart messages within 1-2 business days.  For prescription refills, please ask your pharmacy to contact our office. Our fax number is [phone].  If you have an urgent issue when the clinic is closed that cannot wait until the next business day, you can page your doctor at the number below.     Please note that while we do our best to be available for urgent issues outside of office hours, we are not available 24/7.    If you have an urgent issue and are unable to reach [place], you may choose to seek medical care at your doctor's office, retail clinic, urgent care center, or emergency room.   If you have a medical emergency, please immediately call 911 or go to the emergency department. In the event of inclement weather, please call our main line at [phone] for an update on the status of any  delays or closures.  Dermatology Medication Tips: Please keep the boxes that topical medications come in in order to help keep track of the instructions about where and how to use these. Pharmacies typically print the medication instructions only on the boxes and not directly on the medication tubes.   If your medication is too expensive, please contact our office at [phone] or send [place] a message through MyChart.    We are unable to tell what your co-pay for medications will be in advance as  this is different depending on your insurance coverage. However, we may be able to find a substitute medication at lower cost or fill out paperwork to get insurance to cover a needed medication.    If a prior authorization is required to get your medication covered by your insurance company, please allow [place] 1-2 business days to complete this process.   Drug prices often vary depending on where the prescription is filled and some pharmacies may offer cheaper prices.   The website www.goodrx.com contains coupons for medications through different pharmacies. The prices here do not account for what the cost may be with help from insurance (it may be cheaper with your insurance), but the website can give you the price if you did not use any insurance.  - You can print the associated coupon and take it with your prescription to the pharmacy.  - You may also stop by our office during regular business hours and pick up a GoodRx coupon card.  - If you need your prescription sent electronically to a different pharmacy, notify our office through [facility] or by phone at [phone]

## 2023-03-09 DIAGNOSIS — I10 Essential (primary) hypertension: Secondary | ICD-10-CM | POA: Diagnosis not present

## 2023-03-09 DIAGNOSIS — R7303 Prediabetes: Secondary | ICD-10-CM | POA: Diagnosis not present

## 2023-03-09 DIAGNOSIS — E559 Vitamin D deficiency, unspecified: Secondary | ICD-10-CM | POA: Diagnosis not present

## 2023-03-09 DIAGNOSIS — K5903 Drug induced constipation: Secondary | ICD-10-CM | POA: Diagnosis not present

## 2023-03-12 DIAGNOSIS — F4322 Adjustment disorder with anxiety: Secondary | ICD-10-CM | POA: Diagnosis not present

## 2023-03-17 NOTE — Telephone Encounter (Signed)
Yes, the regimen she outlined is in line with the notes from her AVS.  She must stop the arazlo 5-7 days before waxing, she does not have to stop for threading.  Yes, the HA or niacinamide toner is fine to use  Thanks!

## 2023-04-13 DIAGNOSIS — E559 Vitamin D deficiency, unspecified: Secondary | ICD-10-CM | POA: Diagnosis not present

## 2023-04-13 DIAGNOSIS — R7303 Prediabetes: Secondary | ICD-10-CM | POA: Diagnosis not present

## 2023-04-13 DIAGNOSIS — K5903 Drug induced constipation: Secondary | ICD-10-CM | POA: Diagnosis not present

## 2023-04-13 DIAGNOSIS — I10 Essential (primary) hypertension: Secondary | ICD-10-CM | POA: Diagnosis not present

## 2023-04-26 DIAGNOSIS — F4322 Adjustment disorder with anxiety: Secondary | ICD-10-CM | POA: Diagnosis not present

## 2023-05-10 DIAGNOSIS — F4322 Adjustment disorder with anxiety: Secondary | ICD-10-CM | POA: Diagnosis not present

## 2023-05-25 DIAGNOSIS — R7303 Prediabetes: Secondary | ICD-10-CM | POA: Diagnosis not present

## 2023-05-25 DIAGNOSIS — K5903 Drug induced constipation: Secondary | ICD-10-CM | POA: Diagnosis not present

## 2023-05-25 DIAGNOSIS — E559 Vitamin D deficiency, unspecified: Secondary | ICD-10-CM | POA: Diagnosis not present

## 2023-05-25 DIAGNOSIS — I10 Essential (primary) hypertension: Secondary | ICD-10-CM | POA: Diagnosis not present

## 2023-05-26 DIAGNOSIS — F4322 Adjustment disorder with anxiety: Secondary | ICD-10-CM | POA: Diagnosis not present

## 2023-05-30 ENCOUNTER — Other Ambulatory Visit: Payer: Self-pay | Admitting: Dermatology

## 2023-05-30 DIAGNOSIS — L709 Acne, unspecified: Secondary | ICD-10-CM

## 2023-06-07 DIAGNOSIS — F4322 Adjustment disorder with anxiety: Secondary | ICD-10-CM | POA: Diagnosis not present

## 2023-06-08 ENCOUNTER — Ambulatory Visit: Payer: BC Managed Care – PPO | Admitting: Dermatology

## 2023-06-08 DIAGNOSIS — L709 Acne, unspecified: Secondary | ICD-10-CM

## 2023-06-08 DIAGNOSIS — L810 Postinflammatory hyperpigmentation: Secondary | ICD-10-CM

## 2023-06-08 DIAGNOSIS — L700 Acne vulgaris: Secondary | ICD-10-CM

## 2023-06-08 MED ORDER — CLINDAMYCIN PHOSPHATE 1 % EX SWAB: 1.0000 | Freq: Every morning | CUTANEOUS | 4 refills | Status: DC

## 2023-06-08 MED ORDER — SPIRONOLACTONE 100 MG PO TABS: 100.0000 mg | ORAL_TABLET | Freq: Every day | ORAL | 3 refills | Status: DC

## 2023-06-08 MED ORDER — ARAZLO 0.045 % EX LOTN: 1.0000 | TOPICAL_LOTION | Freq: Every evening | CUTANEOUS | 4 refills | Status: DC

## 2023-06-08 NOTE — Progress Notes (Signed)
Follow-Up Visit   Subjective  Sarah Giles is a 33 y.o. female who presents for the following: Acne Vulgaris Patient was given rx of Arazlo to apply 3 qhs weekly, Spironolactone 100 mg po qd, Clindamycin Swab for acne. She reports she is having minimal breakouts and no more cystic painful acne.   Follow up on Post inflammatory Hyperpigmentation - patient is currently using Melaxemic Cream every other day to help fade dark spots at face. She reports she has noticed some fading of spots.   The following portions of the chart were reviewed this encounter and updated as appropriate: medications, allergies, medical history  Review of Systems:  No other skin or systemic complaints except as noted in HPI or Assessment and Plan.  Objective  Well appearing patient in no apparent distress; mood and affect are within normal limits.  Areas Examined: Face, chest and back  Relevant exam findings are noted in the Assessment and Plan.   Assessment & Plan   Sarah Giles was seen for a follow-up on her acne treatment. She has been using Arazlo three nights a week, along with oral spironolactone and topical clindamycin, resulting in significant improvement. She reports occasional mild flakiness on her nose. She is also using a prescription topical for dark spots on alternate nights without issues. The current regimen is effective, and she will continue with the same medications, with a potential increase in Arazlo usage in the coming months. A follow-up is scheduled for September or October.  ACNE VULGARIS Exam:   Chronic and persistent condition with duration or expected duration over one year. Condition is improving with treatment but not currently at goal.   Treatment Plan:  -Continue Spironolactone 100 mg po qd Refills 3  - Continue Clindamycin 1 % swab apply topically in the morning refills 3  -Continue Tazarotene 0.045 % lotion - apply pea sized amount 3 nights a week. Consider increasing  to 5 nights weekly in May or June for faster pigmentation clearance.   - Chemical Peel: If considering a chemical peel, stop using Arazlo one week before and do not use it for at least two weeks after the peel. Schedule the peel before the end of March to minimize sun exposure post-treatment.  Spironolactone can cause increased urination and cause blood pressure to decrease. Please watch for signs of lightheadedness and be cautious when changing position. It can sometimes cause breast tenderness or an irregular period in premenopausal women. It can also increase potassium. The increase in potassium usually is not a concern unless you are taking other medicines that also increase potassium, so please be sure your doctor knows all of the other medications you are taking. This medication should not be taken by pregnant women.  This medicine should also not be taken together with sulfa drugs like Bactrim (trimethoprim/sulfamethexazole).   Topical retinoid medications like tretinoin/Retin-A, adapalene/Differin, tazarotene/Fabior, and Epiduo/Epiduo Forte can cause dryness and irritation when first started. Only apply a pea-sized amount to the entire affected area. Avoid applying it around the eyes, edges of mouth and creases at the nose. If you experience irritation, use a good moisturizer first and/or apply the medicine less often. If you are doing well with the medicine, you can increase how often you use it until you are applying every night. Be careful with sun protection while using this medication as it can make you sensitive to the sun. This medicine should not be used by pregnant women.    Post- Inflammatory Hyperpigmentation  Exam:  Brown macules in the areas of prior acne lesions  Treatment Plan:  Continue Melaxemic Cream (Tranexamic Acid 5%, Kojic Acid 2%, Vit C 2.5%, Hyaluronic Acid 0.1%) sent to [facility] - Once skin is acclimated to retinoid we will add in a separate topical lightening  agent use every other day (when not using retinoid)  Vitamin C Serum: Apply MedRock Vitamin C serum morning and night to aid in reducing dark spots.  - Recommended Continue using La Roche-Posay Toleriane UV sunscreen. Reapply every three hours, especially during your upcoming trip to [place]. Wear a hat and use mineral sunscreen for additional protection against dark spots.   No follow-ups on file.  I, Asher Muir, CMA, am acting as scribe for [employer], DO.   Documentation: I have reviewed the above documentation for accuracy and completeness, and I agree with the above.  Langston Reusing, DO

## 2023-06-08 NOTE — Patient Instructions (Signed)
Hello Sarah Giles,  Thank you for visiting [place] today. We appreciate your commitment to improving your skin health. Here is a summary of the key instructions from today's consultation:  Arazlo Usage: Continue using Arazlo three nights a week. Consider increasing to five nights a week in May or June for faster pigmentation clearance.  Medications: Continue taking spironolactone and using clindamycin swabs as prescribed.  Topical Treatment for Dark Spots: Use the prescribed topical treatment on the nights you are not using Arazlo.  Vitamin C Serum: Apply MedRock Vitamin C serum morning and night to aid in reducing dark spots.  Sunscreen: Continue using La Roche-Posay Toleriane UV sunscreen. Reapply every three hours, especially during your upcoming trip to [place]. Wear a hat and use mineral sunscreen for additional protection against dark spots.  Refills: We have arranged a refill of spironolactone and clindamycin swabs for three months with three refills. The Arazlo should last you about a year.  Chemical Peel: If considering a chemical peel, stop using Arazlo one week before and do not use it for at least two weeks after the peel. Schedule the peel before the end of March to minimize sun exposure post-treatment.  Follow-Up: Follow up with [place] in September or October to reassess your treatment plan.  Please contact us via MyChart for any additional needs or questions.  Best regards,  Dr. [facility]    Important Information  Due to recent changes in healthcare laws, you may see results of your pathology and/or laboratory studies on MyChart before the doctors have had a chance to review them. We understand that in some cases there may be results that are confusing or concerning to you. Please understand that not all results are received at the same time and often the doctors may need to interpret multiple results in order to provide you with the best plan of care or course of  treatment. Therefore, we ask that you please give [place] 2 business days to thoroughly review all your results before contacting the office for clarification. Should we see a critical lab result, you will be contacted sooner.   If You Need Anything After Your Visit  If you have any questions or concerns for your doctor, please call our main line at [phone] If no one answers, please leave a voicemail as directed and we will return your call as soon as possible. Messages left after 4 pm will be answered the following business day.   You may also send [place] a message via MyChart. We typically respond to MyChart messages within 1-2 business days.  For prescription refills, please ask your pharmacy to contact our office. Our fax number is [phone].  If you have an urgent issue when the clinic is closed that cannot wait until the next business day, you can page your doctor at the number below.    Please note that while we do our best to be available for urgent issues outside of office hours, we are not available 24/7.   If you have an urgent issue and are unable to reach [place], you may choose to seek medical care at your doctor's office, retail clinic, urgent care center, or emergency room.  If you have a medical emergency, please immediately call 911 or go to the emergency department. In the event of inclement weather, please call our main line at [phone] for an update on the status of any delays or closures.  Dermatology Medication Tips: Please keep the boxes that topical medications come in  in order to help keep track of the instructions about where and how to use these. Pharmacies typically print the medication instructions only on the boxes and not directly on the medication tubes.   If your medication is too expensive, please contact our office at [phone] or send [place] a message through MyChart.   We are unable to tell what your co-pay for medications will be in advance as this is  different depending on your insurance coverage. However, we may be able to find a substitute medication at lower cost or fill out paperwork to get insurance to cover a needed medication.   If a prior authorization is required to get your medication covered by your insurance company, please allow [place] 1-2 business days to complete this process.  Drug prices often vary depending on where the prescription is filled and some pharmacies may offer cheaper prices.  The website www.goodrx.com contains coupons for medications through different pharmacies. The prices here do not account for what the cost may be with help from insurance (it may be cheaper with your insurance), but the website can give you the price if you did not use any insurance.  - You can print the associated coupon and take it with your prescription to the pharmacy.  - You may also stop by our office during regular business hours and pick up a GoodRx coupon card.  - If you need your prescription sent electronically to a different pharmacy, notify our office through [facility] or by phone at [phone]

## 2023-06-21 DIAGNOSIS — R7303 Prediabetes: Secondary | ICD-10-CM | POA: Diagnosis not present

## 2023-06-21 DIAGNOSIS — E559 Vitamin D deficiency, unspecified: Secondary | ICD-10-CM | POA: Diagnosis not present

## 2023-06-21 DIAGNOSIS — I10 Essential (primary) hypertension: Secondary | ICD-10-CM | POA: Diagnosis not present

## 2023-06-21 DIAGNOSIS — K5903 Drug induced constipation: Secondary | ICD-10-CM | POA: Diagnosis not present

## 2023-07-26 DIAGNOSIS — F411 Generalized anxiety disorder: Secondary | ICD-10-CM | POA: Diagnosis not present

## 2023-07-28 DIAGNOSIS — R7303 Prediabetes: Secondary | ICD-10-CM | POA: Diagnosis not present

## 2023-07-28 DIAGNOSIS — K5903 Drug induced constipation: Secondary | ICD-10-CM | POA: Diagnosis not present

## 2023-07-28 DIAGNOSIS — E559 Vitamin D deficiency, unspecified: Secondary | ICD-10-CM | POA: Diagnosis not present

## 2023-07-28 DIAGNOSIS — I10 Essential (primary) hypertension: Secondary | ICD-10-CM | POA: Diagnosis not present

## 2023-08-17 DIAGNOSIS — Z124 Encounter for screening for malignant neoplasm of cervix: Secondary | ICD-10-CM | POA: Diagnosis not present

## 2023-08-17 DIAGNOSIS — Z01419 Encounter for gynecological examination (general) (routine) without abnormal findings: Secondary | ICD-10-CM | POA: Diagnosis not present

## 2023-08-17 DIAGNOSIS — Z0142 Encounter for cervical smear to confirm findings of recent normal smear following initial abnormal smear: Secondary | ICD-10-CM | POA: Diagnosis not present

## 2023-08-17 DIAGNOSIS — Z1151 Encounter for screening for human papillomavirus (HPV): Secondary | ICD-10-CM | POA: Diagnosis not present

## 2023-08-23 DIAGNOSIS — F411 Generalized anxiety disorder: Secondary | ICD-10-CM | POA: Diagnosis not present

## 2023-08-23 DIAGNOSIS — R7303 Prediabetes: Secondary | ICD-10-CM | POA: Diagnosis not present

## 2023-08-23 DIAGNOSIS — E66813 Obesity, class 3: Secondary | ICD-10-CM | POA: Diagnosis not present

## 2023-08-23 DIAGNOSIS — I10 Essential (primary) hypertension: Secondary | ICD-10-CM | POA: Diagnosis not present

## 2023-09-23 DIAGNOSIS — N80129 Deep endometriosis of ovary, unspecified ovary: Secondary | ICD-10-CM | POA: Diagnosis not present

## 2023-09-23 DIAGNOSIS — N946 Dysmenorrhea, unspecified: Secondary | ICD-10-CM | POA: Diagnosis not present

## 2023-09-24 DIAGNOSIS — F411 Generalized anxiety disorder: Secondary | ICD-10-CM | POA: Diagnosis not present

## 2023-09-27 DIAGNOSIS — K5903 Drug induced constipation: Secondary | ICD-10-CM | POA: Diagnosis not present

## 2023-09-27 DIAGNOSIS — E559 Vitamin D deficiency, unspecified: Secondary | ICD-10-CM | POA: Diagnosis not present

## 2023-09-27 DIAGNOSIS — I10 Essential (primary) hypertension: Secondary | ICD-10-CM | POA: Diagnosis not present

## 2023-09-27 DIAGNOSIS — R7303 Prediabetes: Secondary | ICD-10-CM | POA: Diagnosis not present

## 2023-10-28 DIAGNOSIS — F411 Generalized anxiety disorder: Secondary | ICD-10-CM | POA: Diagnosis not present

## 2023-11-01 DIAGNOSIS — K5903 Drug induced constipation: Secondary | ICD-10-CM | POA: Diagnosis not present

## 2023-11-01 DIAGNOSIS — R7303 Prediabetes: Secondary | ICD-10-CM | POA: Diagnosis not present

## 2023-11-01 DIAGNOSIS — I10 Essential (primary) hypertension: Secondary | ICD-10-CM | POA: Diagnosis not present

## 2023-11-01 DIAGNOSIS — E559 Vitamin D deficiency, unspecified: Secondary | ICD-10-CM | POA: Diagnosis not present

## 2023-11-02 DIAGNOSIS — I10 Essential (primary) hypertension: Secondary | ICD-10-CM | POA: Diagnosis not present

## 2023-11-02 DIAGNOSIS — E559 Vitamin D deficiency, unspecified: Secondary | ICD-10-CM | POA: Diagnosis not present

## 2023-11-02 DIAGNOSIS — R7303 Prediabetes: Secondary | ICD-10-CM | POA: Diagnosis not present

## 2023-11-02 DIAGNOSIS — R5383 Other fatigue: Secondary | ICD-10-CM | POA: Diagnosis not present

## 2023-11-03 DIAGNOSIS — I471 Supraventricular tachycardia, unspecified: Secondary | ICD-10-CM | POA: Diagnosis not present

## 2023-11-03 DIAGNOSIS — J45998 Other asthma: Secondary | ICD-10-CM | POA: Diagnosis not present

## 2023-11-03 DIAGNOSIS — R062 Wheezing: Secondary | ICD-10-CM | POA: Diagnosis not present

## 2023-11-03 DIAGNOSIS — H1045 Other chronic allergic conjunctivitis: Secondary | ICD-10-CM | POA: Diagnosis not present

## 2023-11-03 DIAGNOSIS — J301 Allergic rhinitis due to pollen: Secondary | ICD-10-CM | POA: Diagnosis not present

## 2023-11-03 DIAGNOSIS — J45909 Unspecified asthma, uncomplicated: Secondary | ICD-10-CM | POA: Diagnosis not present

## 2023-11-18 DIAGNOSIS — R42 Dizziness and giddiness: Secondary | ICD-10-CM | POA: Diagnosis not present

## 2023-11-19 DIAGNOSIS — R42 Dizziness and giddiness: Secondary | ICD-10-CM | POA: Diagnosis not present

## 2023-11-23 DIAGNOSIS — I10 Essential (primary) hypertension: Secondary | ICD-10-CM | POA: Diagnosis not present

## 2023-11-23 DIAGNOSIS — Z1330 Encounter for screening examination for mental health and behavioral disorders, unspecified: Secondary | ICD-10-CM | POA: Diagnosis not present

## 2023-11-23 DIAGNOSIS — H8110 Benign paroxysmal vertigo, unspecified ear: Secondary | ICD-10-CM | POA: Diagnosis not present

## 2023-12-08 DIAGNOSIS — N80129 Deep endometriosis of ovary, unspecified ovary: Secondary | ICD-10-CM | POA: Diagnosis not present

## 2023-12-30 DIAGNOSIS — Z01818 Encounter for other preprocedural examination: Secondary | ICD-10-CM | POA: Diagnosis not present

## 2024-02-08 ENCOUNTER — Ambulatory Visit (INDEPENDENT_AMBULATORY_CARE_PROVIDER_SITE_OTHER): Admitting: Dermatology

## 2024-02-08 ENCOUNTER — Ambulatory Visit: Payer: BC Managed Care – PPO | Admitting: Dermatology

## 2024-02-08 ENCOUNTER — Encounter: Payer: Self-pay | Admitting: Dermatology

## 2024-02-08 VITALS — BP 141/91 | HR 74

## 2024-02-08 DIAGNOSIS — L810 Postinflammatory hyperpigmentation: Secondary | ICD-10-CM

## 2024-02-08 DIAGNOSIS — L709 Acne, unspecified: Secondary | ICD-10-CM

## 2024-02-08 DIAGNOSIS — L700 Acne vulgaris: Secondary | ICD-10-CM

## 2024-02-08 MED ORDER — CLINDAMYCIN PHOSPHATE 1 % EX SWAB: 1.0000 | Freq: Every morning | CUTANEOUS | 8 refills | Status: AC

## 2024-02-08 MED ORDER — ARAZLO 0.045 % EX LOTN: 1.0000 | TOPICAL_LOTION | Freq: Every evening | CUTANEOUS | 2 refills | Status: AC

## 2024-02-08 MED ORDER — SPIRONOLACTONE 100 MG PO TABS: 100.0000 mg | ORAL_TABLET | Freq: Every day | ORAL | 3 refills | Status: AC

## 2024-02-08 NOTE — Patient Instructions (Addendum)
 VISIT SUMMARY:  Today, you had a follow-up appointment to discuss your hormonal acne. We reviewed your current treatment regimen and made some adjustments to help manage your symptoms more effectively.  YOUR PLAN:  -ACNE VULGARIS WITH POSTINFLAMMATORY HYPERPIGMENTATION:  Acne vulgaris is a common skin condition that causes pimples, including whiteheads, blackheads, and inflamed patches of skin. Your acne is well-managed with your current medications, but you experience occasional whiteheads around your menstrual cycle.  We recommend continuing your current regimen: Arazlo  two to three nights a week, spironolactone  daily, and clindamycin  in the mornings.  For any dark spots, you can use Eucerin Radiant Tone twice a day. Additionally, use Ciclophate Cream at night to lock in moisture and reduce irritation from Arazlo . Samples of Eucerin Radiant Tone and Ciclophate have been provided.  INSTRUCTIONS:  Please continue with your current medications as discussed. If you have any concerns or notice any changes in your symptoms, schedule a follow-up appointment. Also, keep us  updated on your upcoming procedure for endometriosis and any changes in your overall health.   Important Information  Due to recent changes in healthcare laws, you may see results of your pathology and/or laboratory studies on MyChart before the doctors have had a chance to review them. We understand that in some cases there may be results that are confusing or concerning to you. Please understand that not all results are received at the same time and often the doctors may need to interpret multiple results in order to provide you with the best plan of care or course of treatment. Therefore, we ask that you please give us  2 business days to thoroughly review all your results before contacting the office for clarification. Should we see a critical lab result, you will be contacted sooner.   If You Need Anything After Your  Visit  If you have any questions or concerns for your doctor, please call our main line at [phone] If no one answers, please leave a voicemail as directed and we will return your call as soon as possible. Messages left after 4 pm will be answered the following business day.   You may also send us  a message via MyChart. We typically respond to MyChart messages within 1-2 business days.  For prescription refills, please ask your pharmacy to contact our office. Our fax number is [phone].  If you have an urgent issue when the clinic is closed that cannot wait until the next business day, you can page your doctor at the number below.    Please note that while we do our best to be available for urgent issues outside of office hours, we are not available 24/7.   If you have an urgent issue and are unable to reach us , you may choose to seek medical care at your doctor's office, retail clinic, urgent care center, or emergency room.  If you have a medical emergency, please immediately call 911 or go to the emergency department. In the event of inclement weather, please call our main line at [phone] for an update on the status of any delays or closures.  Dermatology Medication Tips: Please keep the boxes that topical medications come in in order to help keep track of the instructions about where and how to use these. Pharmacies typically print the medication instructions only on the boxes and not directly on the medication tubes.   If your medication is too expensive, please contact our office at [phone] or send us  a message through MyChart.   We  are unable to tell what your co-pay for medications will be in advance as this is different depending on your insurance coverage. However, we may be able to find a substitute medication at lower cost or fill out paperwork to get insurance to cover a needed medication.   If a prior authorization is required to get your medication covered by  your insurance company, please allow us  1-2 business days to complete this process.  Drug prices often vary depending on where the prescription is filled and some pharmacies may offer cheaper prices.  The website www.goodrx.com contains coupons for medications through different pharmacies. The prices here do not account for what the cost may be with help from insurance (it may be cheaper with your insurance), but the website can give you the price if you did not use any insurance.  - You can print the associated coupon and take it with your prescription to the pharmacy.  - You may also stop by our office during regular business hours and pick up a GoodRx coupon card.  - If you need your prescription sent electronically to a different pharmacy, notify our office through [facility] or by phone at [phone]

## 2024-02-08 NOTE — Progress Notes (Signed)
   Follow-Up Visit   Subjective  Sarah Giles is a 33 y.o. female who presents for the following: Acne  Patient present today for follow up visit for Acne. Patient was last evaluated on 06/08/2023. At this visit patient was advised to continue Arazlo  0.045% three nights a week, Spironolactone  100mg  and Clindamycin  swabs. Patient reports that she has been using the Arazle and Spironolactone . Patient reports she has not been using the Clindamycin  swabs (she has just not picked them up from pharmacy) Patient reports sxs are much better. Patient denies medication changes. Patient reports that she has not had any recent flare ups. Patient is not pregnant nor trying to conceive.  Post Infammatory Hyperpigmentation: Patient was advised to continue use of Melaxemic cream (Tranexamic Acid 5%, Kojic Acid 2%, Vit C 2.5%, Hyaluronic Acid 0.1%)  Patient was recommended to use a vitamin C serum and to continue use of La Roche-Posay Toleriane sunscreen Patient reports that she has been using the Melaxemic cream, vitamin c and La-Roche Posay sunscreen. Patient reports that symptoms are better  The following portions of the chart were reviewed this encounter and updated as appropriate: medications, allergies, medical history  Review of Systems:  No other skin or systemic complaints except as noted in HPI or Assessment and Plan.  Objective  Well appearing patient in no apparent distress; mood and affect are within normal limits.  A focused examination was performed of the following areas: Face, chest and back  Relevant exam findings are noted in the Assessment and Plan.           Assessment & Plan   ACNE VULGARIS and PIH  Assessment and Plan Acne vulgaris with postinflammatory hyperpigmentation Acne vulgaris is well-managed with Arazlo  and spironolactone . Occasional whiteheads occur around her menstrual cycle but resolve quickly. Postinflammatory hyperpigmentation is not significant, and  prescription lightening cream is not needed. Arazlo  use is inconsistent, which is acceptable, especially during winter when skin tolerance may decrease. - Continue Arazlo  two to three nights a week as tolerated. - Continue spironolactone  100mg  daily with a three-month supply and three refills. - Resume clindamycin  swabs in the mornings with eight refills for superficial acne not covered by spironolactone . - Use Eucerin Radiant Tone twice a day on any dark areas for potential hyperpigmentation. - Use Ciclophate Cream at night to lock in moisture and reduce irritation from Arazlo . - Provide samples of Eucerin Radiant Tone and Ciclophate . ACNE, UNSPECIFIED ACNE TYPE   Related Medications clindamycin  (CLEOCIN  T) 1 % SWAB Apply 1 Application topically in the morning. spironolactone  (ALDACTONE ) 100 MG tablet Take 1 tablet (100 mg total) by mouth daily. Tazarotene  (ARAZLO ) 0.045 % LOTN Apply 1 Application topically at bedtime.  Return in about 1 year (around 02/07/2025) for Acne F/U.  I, Sarah Giles, as acting as a Neurosurgeon for [employer], DO .   Documentation: I have reviewed the above documentation for accuracy and completeness, and I agree with the above.  Sarah Lenis, DO

## 2024-02-16 ENCOUNTER — Encounter (HOSPITAL_COMMUNITY): Payer: Self-pay

## 2024-02-16 NOTE — Progress Notes (Signed)
 Surgical Instructions   Your procedure is scheduled on Wednesday February 23, 2024. Report to [facility] Main Entrance A at 5:30 A.M., then check in with the Admitting office. Any questions or running late day of surgery: call [phone]  Questions prior to your surgery date: call [phone], Monday-Friday, 8am-4pm. If you experience any cold or flu symptoms such as cough, fever, chills, shortness of breath, etc. between now and your scheduled surgery, please notify us  at the above number.     Remember:  Do not eat after midnight the night before your surgery   You may drink clear liquids until 4:30 the morning of your surgery.   Clear liquids allowed are: Water, Non-Citrus Juices (without pulp), Carbonated Beverages, Clear Tea (no milk, honey, etc.), Black Coffee Only (NO MILK, CREAM OR POWDERED CREAMER of any kind), and Gatorade.    Take these medicines the morning of surgery with A SIP OF WATER  metoprolol tartrate (LOPRESSOR)   May take these medicines IF NEEDED: None   One week prior to surgery, STOP taking any Aspirin (unless otherwise instructed by your surgeon) Aleve, Naproxen, Ibuprofen , Motrin , Advil , Goody's, BC's, all herbal medications, fish oil, and non-prescription vitamins.                     Do NOT Smoke (Tobacco/Vaping) for 24 hours prior to your procedure.  If you use a CPAP at night, you may bring your mask/headgear for your overnight stay.   You will be asked to remove any contacts, glasses, piercing's, hearing aid's, dentures/partials prior to surgery. Please bring cases for these items if needed.    Patients discharged the day of surgery will not be allowed to drive home, and someone needs to stay with them for 24 hours.  SURGICAL WAITING ROOM VISITATION Patients may have no more than 2 support people in the waiting area - these visitors may rotate.   Pre-op nurse will coordinate an appropriate time for 1 ADULT support person, who may not rotate, to  accompany patient in pre-op.  Children under the age of 78 must have an adult with them who is not the patient and must remain in the main waiting area with an adult.  If the patient needs to stay at the hospital during part of their recovery, the visitor guidelines for inpatient rooms apply.  Please refer to the [facility] website for the visitor guidelines for any additional information.   If you received a COVID test during your pre-op visit  it is requested that you wear a mask when out in public, stay away from anyone that may not be feeling well and notify your surgeon if you develop symptoms. If you have been in contact with anyone that has tested positive in the last 10 days please notify you surgeon.      Pre-operative CHG Bathing Instructions   You can play a key role in reducing the risk of infection after surgery. Your skin needs to be as free of germs as possible. You can reduce the number of germs on your skin by washing with CHG (chlorhexidine gluconate) soap before surgery. CHG is an antiseptic soap that kills germs and continues to kill germs even after washing.   DO NOT use if you have an allergy to chlorhexidine/CHG or antibacterial soaps. If your skin becomes reddened or irritated, stop using the CHG and notify one of our RNs at [phone].  TAKE A SHOWER THE NIGHT BEFORE SURGERY   Please keep in mind the following:  DO NOT shave, including legs and underarms, 48 hours prior to surgery.   Place clean sheets on your bed the night before surgery Use a clean washcloth (not used since being washed) for shower. DO NOT sleep with pet's night before surgery.  CHG Shower Instructions:  Wash your face and private area with normal soap. If you choose to wash your hair, wash first with your normal shampoo.  After you use shampoo/soap, rinse your hair and body thoroughly to remove shampoo/soap residue.  Turn the water OFF and apply half the bottle of CHG soap to a  CLEAN washcloth.  Apply CHG soap ONLY FROM YOUR NECK DOWN TO YOUR TOES (washing for 3-5 minutes)  DO NOT use CHG soap on face, private areas, open wounds, or sores.  Pay special attention to the area where your surgery is being performed.  If you are having back surgery, having someone wash your back for you may be helpful. Wait 2 minutes after CHG soap is applied, then you may rinse off the CHG soap.  Pat dry with a clean towel  Put on clean pajamas    Additional instructions for the day of surgery: If you choose, you may shower the morning of surgery with an antibacterial soap.  DO NOT APPLY any lotions, deodorants or perfumes.   Do not wear jewelry or makeup Do not wear nail polish, gel polish, artificial nails, or any other type of covering on natural nails (fingers and toes) Do not bring valuables to the hospital. [facility] is not responsible for valuables/personal belongings. Put on clean/comfortable clothes.  Please brush your teeth.  Ask your nurse before applying any prescription medications to the skin.

## 2024-02-17 ENCOUNTER — Encounter (HOSPITAL_COMMUNITY)
Admission: RE | Admit: 2024-02-17 | Discharge: 2024-02-17 | Disposition: A | Discharge status: Home / Self Care | Source: Ambulatory Visit | Attending: Obstetrics and Gynecology | Admitting: Obstetrics and Gynecology

## 2024-02-17 ENCOUNTER — Other Ambulatory Visit: Payer: Self-pay

## 2024-02-17 ENCOUNTER — Encounter (HOSPITAL_COMMUNITY): Payer: Self-pay

## 2024-02-17 VITALS — BP 130/88 | HR 88 | Temp 98.4°F | Resp 17 | Ht 66.0 in | Wt 296.0 lb

## 2024-02-17 DIAGNOSIS — N83201 Unspecified ovarian cyst, right side: Secondary | ICD-10-CM | POA: Diagnosis not present

## 2024-02-17 DIAGNOSIS — I493 Ventricular premature depolarization: Secondary | ICD-10-CM | POA: Insufficient documentation

## 2024-02-17 DIAGNOSIS — I471 Supraventricular tachycardia, unspecified: Secondary | ICD-10-CM | POA: Diagnosis not present

## 2024-02-17 DIAGNOSIS — Z01818 Encounter for other preprocedural examination: Secondary | ICD-10-CM | POA: Diagnosis present

## 2024-02-17 DIAGNOSIS — I119 Hypertensive heart disease without heart failure: Secondary | ICD-10-CM | POA: Diagnosis not present

## 2024-02-17 DIAGNOSIS — N83202 Unspecified ovarian cyst, left side: Secondary | ICD-10-CM | POA: Insufficient documentation

## 2024-02-17 DIAGNOSIS — R001 Bradycardia, unspecified: Secondary | ICD-10-CM | POA: Diagnosis not present

## 2024-02-17 DIAGNOSIS — R7303 Prediabetes: Secondary | ICD-10-CM | POA: Diagnosis not present

## 2024-02-17 DIAGNOSIS — I491 Atrial premature depolarization: Secondary | ICD-10-CM | POA: Insufficient documentation

## 2024-02-17 DIAGNOSIS — R002 Palpitations: Secondary | ICD-10-CM | POA: Insufficient documentation

## 2024-02-17 HISTORY — DX: Prediabetes: R73.03

## 2024-02-17 LAB — BASIC METABOLIC PANEL WITH GFR
Anion gap: 10 (ref 5–15)
BUN: 14 mg/dL (ref 6–20)
CO2: 22 mmol/L (ref 22–32)
Calcium: 9 mg/dL (ref 8.9–10.3)
Chloride: 105 mmol/L (ref 98–111)
Creatinine, Ser: 0.8 mg/dL (ref 0.44–1.00)
GFR, Estimated: >60 mL/min (ref >60)
Glucose, Bld: 98 mg/dL (ref 70–99)
Potassium: 4.2 mmol/L (ref 3.5–5.1)
Sodium: 137 mmol/L (ref 135–145)

## 2024-02-17 LAB — CBC
HCT: 40.9 % (ref 36.0–46.0)
Hemoglobin: 12.6 g/dL (ref 12.0–15.0)
MCH: 24.7 pg — ABNORMAL LOW (ref 26.0–34.0)
MCHC: 30.8 g/dL (ref 30.0–36.0)
MCV: 80 fL (ref 80.0–100.0)
Platelets: 355 K/uL (ref 150–400)
RBC: 5.11 MIL/uL (ref 3.87–5.11)
RDW: 14.7 % (ref 11.5–15.5)
WBC: 7.9 K/uL (ref 4.0–10.5)
nRBC: 0 % (ref 0.0–0.2)

## 2024-02-17 NOTE — Progress Notes (Signed)
 PCP - Valma Dom, PA-C   Cardiologist - Aleene Alveta ROLLA Fredrica, Youlanda, MD    PPM/ICD - denies Device Orders -  Rep Notified -   Chest x-ray - na EKG - 02/17/24 Stress Test -  ECHO - 11/2019 Cardiac Cath - denies  Sleep Study - denies CPAP - no  Fasting Blood Sugar - na Checks Blood Sugar _____ times a day  Last dose of GLP1 agonist-  last dose of Zepbound 12/30/23 GLP1 instructions: continue holding Zepbound prior to surgery.  Blood Thinner Instructions:na Aspirin Instructions:na  ERAS Protcol -clear liquids until 0430 PRE-SURGERY Ensure or G2- no  COVID TEST-na    Anesthesia review: yes -abnormal EKG. Pt reports she episodes of NSVT when pregnant in 2020 and 2022. Last followed with Dr. Fredrica in June 2023. Pt denies any episodes of palpitations since pregnancy. Has Metoprolol to take if heart rate is elevated,but says she has had it for 3 years and hasn't taken it.   Patient denies shortness of breath, fever, cough and chest pain at PAT appointment   All instructions explained to the patient, with a verbal understanding of the material. Patient agrees to go over the instructions while at home for a better understanding. Patient also instructed to self quarantine after being tested for COVID-19. The opportunity to ask questions was provided.

## 2024-02-21 NOTE — Anesthesia Preprocedure Evaluation (Signed)
 Anesthesia Evaluation  Patient identified by MRN, date of birth, ID band Patient awake    Reviewed: Allergy & Precautions, NPO status , Patient's Chart, lab work & pertinent test results  History of Anesthesia Complications Negative for: history of anesthetic complications  Airway Mallampati: I  TM Distance: >3 FB Neck ROM: Full    Dental  (+) Teeth Intact, Dental Advisory Given   Pulmonary neg shortness of breath, neg sleep apnea, neg COPD, neg recent URI   breath sounds clear to auscultation       Cardiovascular hypertension,  Rhythm:Regular     Neuro/Psych negative neurological ROS     GI/Hepatic negative GI ROS, Neg liver ROS,,,  Endo/Other    Class 3 obesity  Renal/GU negative Renal ROS     Musculoskeletal negative musculoskeletal ROS (+)    Abdominal   Peds  Hematology negative hematology ROS (+) Lab Results      Component                Value               Date                      WBC                      7.9                 02/17/2024                HGB                      12.6                02/17/2024                HCT                      40.9                02/17/2024                MCV                      80.0                02/17/2024                PLT                      355                 02/17/2024              Anesthesia Other Findings   Reproductive/Obstetrics Lab Results      Component                Value               Date                      PREGTESTUR               NEGATIVE            02/23/2024                HCG                      <  5.0                05/05/2019                                         Anesthesia Physical Anesthesia Plan  ASA: 3  Anesthesia Plan: General   Post-op Pain Management: Ofirmev  IV (intra-op)* and Toradol IV (intra-op)*   Induction: Intravenous  PONV Risk Score and Plan: 4 or greater and Ondansetron  and  Dexamethasone  Airway Management Planned: Oral ETT  Additional Equipment: None  Intra-op Plan:   Post-operative Plan: Extubation in OR  Informed Consent: I have reviewed the patients History and Physical, chart, labs and discussed the procedure including the risks, benefits and alternatives for the proposed anesthesia with the patient or authorized representative who has indicated his/her understanding and acceptance.     Dental advisory given  Plan Discussed with: CRNA  Anesthesia Plan Comments: (PAT note written 02/21/2024 by Jack Mineau, PA-C.  )         Anesthesia Quick Evaluation

## 2024-02-21 NOTE — Progress Notes (Signed)
 Anesthesia Chart Review:  Case: [id number] Date/Time: 02/23/24 0715   Procedure: EXCISION, CYST, OVARY, LAPAROSCOPIC (Bilateral)   Anesthesia type: General   Diagnosis: Bilateral ovarian cysts [N83.201, N83.202]   Pre-op diagnosis: ovarian cysts   [facility]: [facility] ROOM 12 / [facility] OR   Surgeons: Sudie Lavonia HERO, MD       DISCUSSION: Patient is a 33 year old female scheduled for the above procedure.  History includes never smoker, HTN, prediabetes, morbid obesity, palpitations.   Last cardiology visit 07/09/2022 with APP ([facility]) for follow-up palpitations. 2023 Holter monitor showed underlying SR, occasional PVCs, brief SVT (longest 6 seconds), low PVC burden of < 1% (but aware when she had PVCs). She was managed on metoprolol. 2023 cMRI normal. 2023 stress echo was non-ischemia, EF 65-67%.  She reported palpitations improved after pregnancy.  She is no longer taking metoprolol (has if needed).  She reported last dose of Zepbound was 12/30/2023 and was instructed to hold until after surgery.  Anesthesia team to evaluate on the day of surgery.  She is for urine pregnancy test on arrival.   VS: BP 130/88   Pulse 88   Temp 36.9 C   Resp 17   Ht 5' 6 (1.676 m)   Wt 134.3 kg   LMP 02/10/2024 (Exact Date)   SpO2 100%   BMI 47.78 kg/m    PROVIDERS: Valma Lannie LABOR, PA-C is PCP  Fredrica Lipoma, MD is cardiologist   LABS: Labs reviewed: Acceptable for surgery. (all labs ordered are listed, but only abnormal results are displayed)  Labs Reviewed  CBC - Abnormal; Notable for the following components:      Result Value   MCH 24.7 (*)    All other components within normal limits  BASIC METABOLIC PANEL WITH GFR    EKG: 02/17/2024: Normal sinus rhythm Cannot rule out Anterior infarct , age undetermined Abnormal ECG =03-Jul-2022 18:34, \\ Confirmed by Raford Riggs ([id number]) on 02/19/2024 1:16:29 AM   CV: MRI Cardiac 09/04/2021 ([facility]): IMPRESSION:  1. Normal  structural and functional cardiac MRI.    Stress echo 08/22/2021 ([facility]): Left Ventricle: The left ventricular systolic function at rest is  42-39%. Systolic function is normal.    Left Ventricle: Wall motion is normal.    Post-stress: The left ventricle systolic function is normal post-stress  with an EF of 65-67%.    Post-stress: The post-stress echo showed normal wall motion which was  improved compared to baseline.    Stress ECG: ECG Conclusion:  No ischemic ST segment changes occurred  with stress. No chest pain. Exaggerated hypertensive BP response to  exercise with max BP 221/84 mmHg. Good functional capacity - 9.5 METs  which is slightly reduced functional capacity for age and gender.    Post-stress Impression: The study is normal.    Post-stress Impression: This study shows a low prognostic risk.    Post-stress Impression: The ECG and Echo portions of the stress study  are concordant with no evidence of inducible myocardial ischemia.    48 Hour Holter monitor 05/30/2021 ([facility]): Indication: Palpitations.   - Patient remained in sinus rhythm with minimum HR 56 at 7:45 AM on day 2, max HR 166 at 9:44 AM on day 1.  Average HR 79.  - Patient remained bradycardic 0.01 %  of total time.  - Patient remained tachycardic 5.8 % of total time.  - Only 17 PVCs noted.  - 6 beat run of wide-complex tachycardia with heart rate 146 bpm noted at  3:47 PM on day 1.  It likely represented 6 beat run of SVT with the slight aberrancy and unlikely 6 beat run of NSVT.  - Only 1 supraventricular ectopic beats noted.  - 5 patient triggered episodes were recorded.  During those episodes strips revealed sinus rhythm with heart rate 98-146 with 6 beat run of SVT with slight aberrancy and isolated PVCs and 1 isolated PAC noted.   - The longest R to R interval 1.1 at 7:45 AM on February 4 seconds.  - No A. fib, no long pauses 3 seconds or more noted.    Echo 12/14/2019 ([facility]): IMPRESSIONS   Left Ventricle: Systolic function is normal. EF: 60-65%.    Left Ventricle: No regional wall motion abnormalities noted.    Left Atrium: Left atrium is mildly dilated.    Tricuspid Valve: Tricuspid valve structure is normal.    Tricuspid Valve: There is mild regurgitation.    Past Medical History:  Diagnosis Date   Allergy    Gestational hypertension 04/27/2014   Hypertension    Morbid obesity (HCC) 04/27/2014   NSVD (normal spontaneous vaginal delivery) 04/28/2014   Pre-diabetes     Past Surgical History:  Procedure Laterality Date   BREAST SURGERY     2007 reduction   uterine polyps  2021    MEDICATIONS:  clindamycin  (CLEOCIN  T) 1 % SWAB   metoprolol tartrate (LOPRESSOR) 25 MG tablet   spironolactone  (ALDACTONE ) 100 MG tablet   Tazarotene  (ARAZLO ) 0.045 % LOTN   tirzepatide (ZEPBOUND) 5 MG/0.5ML Pen   No current facility-administered medications for this encounter.    Isaiah Ruder, PA-C Surgical Short Stay/Anesthesiology [facility] Phone [phone] [facility] Phone [phone] 02/21/2024 4:47 PM

## 2024-02-22 DIAGNOSIS — I10 Essential (primary) hypertension: Secondary | ICD-10-CM | POA: Diagnosis not present

## 2024-02-22 DIAGNOSIS — R9431 Abnormal electrocardiogram [ECG] [EKG]: Secondary | ICD-10-CM | POA: Diagnosis not present

## 2024-02-22 NOTE — H&P (Signed)
 Sarah Giles is an 33 y.o. female presenting for scheduled procedure  Pertinent Gynecological History: Menses: flow is excessive with use of 5 pads or tampons on heaviest days, regular every month without intermenstrual spotting, and with severe dysmenorrhea Bleeding: dysfunctional uterine bleeding Contraception: condoms DES exposure: denies Blood transfusions: none Sexually transmitted diseases: remote h/o chlamydia Previous GYN Procedures: DNC for uterine polyp Last mammogram: too young Last pap: normal Date: 08/17/23 OB History: G3, P3003   Menstrual History: Menarche age: early teens Patient's last menstrual period was 02/10/2024 (exact date).    Past Medical History:  Diagnosis Date   Allergy    Gestational hypertension 04/27/2014   Hypertension    Morbid obesity (HCC) 04/27/2014   NSVD (normal spontaneous vaginal delivery) 04/28/2014   Pre-diabetes     Past Surgical History:  Procedure Laterality Date   BREAST SURGERY     2007 reduction   uterine polyps  2021    Family History  Problem Relation Age of Onset   Hypertension Mother    Diabetes Father    Hypertension Maternal Grandmother    Diabetes Paternal Grandfather     Social History:  reports that she has never smoked. She has never been exposed to tobacco smoke. She has never used smokeless tobacco. She reports that she does not currently use alcohol after a past usage of about 3.0 standard drinks of alcohol per week. She reports that she does not use drugs.  Allergies:  Allergies  Allergen Reactions   Hydroxyzine  Palpitations   Amlodipine Other (See Comments)    Severe adverse reaction per MD, flushed    Polymyxin B-Trimethoprim Other (See Comments)    Eye burning - irritating    Tobramycin Other (See Comments)    Eye burning, irritated eyes     No medications prior to admission.    Review of Systems  Constitutional:  Negative for chills and fever.  Respiratory:  Negative for shortness of  breath.   Cardiovascular:  Negative for chest pain, palpitations and leg swelling.  Gastrointestinal:  Negative for abdominal pain, nausea and vomiting.  Genitourinary:  Positive for menstrual problem and pelvic pain.  Neurological:  Negative for dizziness, weakness and headaches.  Psychiatric/Behavioral:  Negative for suicidal ideas.     Last menstrual period 02/10/2024. Physical Exam Gen: AAO CV: CTAB, RRR Abd: NTTP, soft GU deferred MSK WNL Psych/Neuro WNL  No results found for this or any previous visit (from the past 24 hours).  No results found. TVUS 8/13: Uterus 9.5x5.8x4.9cm. Lf ov with now 4.77cm endometrioma, adherent to posterior uterus. Rt ov with multiple small cysts, ranging 2.2-3.4cm, does have normal appearing stroma. Other components appear to be both endometrioma and dermoid  Assessment/Plan: 33yo G3P3003 presents for surgical management of known BL endometriomas -TVUS as above -Will move forward with Laparoscopic bilateral ovarian cystectomies Risks of surgery include infection of the uterus, pelvic organs, or skin, inadvertent injury to internal organs, such as bowel or bladder. If there is major injury, extensive surgery may be required. If injury is minor, it may be treated with relative ease. Discussed possibility of excessive blood loss and transfusion. Patient accepts the possibility of blood transfusion, if necessary. Patient understands and agrees to move forward with surgery.  Sarah Giles 02/22/2024, 5:31 PM

## 2024-02-23 ENCOUNTER — Ambulatory Visit (HOSPITAL_COMMUNITY)
Admission: RE | Admit: 2024-02-23 | Discharge: 2024-02-23 | Disposition: A | Discharge status: Home / Self Care | Source: Ambulatory Visit | Attending: Obstetrics and Gynecology | Admitting: Obstetrics and Gynecology

## 2024-02-23 ENCOUNTER — Ambulatory Visit (HOSPITAL_COMMUNITY): Admitting: Anesthesiology

## 2024-02-23 ENCOUNTER — Encounter (HOSPITAL_COMMUNITY): Payer: Self-pay | Admitting: Obstetrics and Gynecology

## 2024-02-23 ENCOUNTER — Encounter (HOSPITAL_COMMUNITY)
Admission: RE | Disposition: A | Discharge status: Home / Self Care | Payer: Self-pay | Source: Ambulatory Visit | Attending: Obstetrics and Gynecology

## 2024-02-23 ENCOUNTER — Other Ambulatory Visit: Payer: Self-pay

## 2024-02-23 ENCOUNTER — Encounter (HOSPITAL_COMMUNITY): Payer: Self-pay | Admitting: Vascular Surgery

## 2024-02-23 DIAGNOSIS — G8929 Other chronic pain: Secondary | ICD-10-CM | POA: Diagnosis not present

## 2024-02-23 DIAGNOSIS — N83292 Other ovarian cyst, left side: Secondary | ICD-10-CM | POA: Insufficient documentation

## 2024-02-23 DIAGNOSIS — K660 Peritoneal adhesions (postprocedural) (postinfection): Secondary | ICD-10-CM | POA: Diagnosis not present

## 2024-02-23 DIAGNOSIS — N80123 Deep endometriosis of bilateral ovaries: Secondary | ICD-10-CM | POA: Insufficient documentation

## 2024-02-23 DIAGNOSIS — N803 Endometriosis of pelvic peritoneum, unspecified: Secondary | ICD-10-CM | POA: Diagnosis not present

## 2024-02-23 DIAGNOSIS — Z302 Encounter for sterilization: Secondary | ICD-10-CM | POA: Diagnosis not present

## 2024-02-23 DIAGNOSIS — E66813 Obesity, class 3: Secondary | ICD-10-CM | POA: Diagnosis not present

## 2024-02-23 DIAGNOSIS — Z6841 Body Mass Index (BMI) 40.0 and over, adult: Secondary | ICD-10-CM | POA: Diagnosis not present

## 2024-02-23 DIAGNOSIS — N9489 Other specified conditions associated with female genital organs and menstrual cycle: Secondary | ICD-10-CM | POA: Insufficient documentation

## 2024-02-23 DIAGNOSIS — N80102 Endometriosis of left ovary, unspecified depth: Secondary | ICD-10-CM | POA: Diagnosis not present

## 2024-02-23 DIAGNOSIS — N83201 Unspecified ovarian cyst, right side: Secondary | ICD-10-CM | POA: Diagnosis not present

## 2024-02-23 DIAGNOSIS — N83202 Unspecified ovarian cyst, left side: Secondary | ICD-10-CM | POA: Diagnosis not present

## 2024-02-23 DIAGNOSIS — I10 Essential (primary) hypertension: Secondary | ICD-10-CM | POA: Insufficient documentation

## 2024-02-23 HISTORY — PX: LAPAROSCOPIC LYSIS OF ADHESIONS: SHX5905

## 2024-02-23 HISTORY — PX: LAPAROSCOPIC BILATERAL SALPINGECTOMY: SHX5889

## 2024-02-23 HISTORY — PX: LAPAROSCOPIC OVARIAN CYSTECTOMY: SHX6248

## 2024-02-23 LAB — POCT PREGNANCY, URINE: Preg Test, Ur: NEGATIVE

## 2024-02-23 SURGERY — EXCISION, CYST, OVARY, LAPAROSCOPIC
Anesthesia: General | Site: Pelvis | Laterality: Bilateral

## 2024-02-23 MED ORDER — ORAL CARE MOUTH RINSE: 15.0000 mL | Freq: Once | OROMUCOSAL | Status: AC

## 2024-02-23 MED ORDER — PHENYLEPHRINE 80 MCG/ML (10ML) SYRINGE FOR IV PUSH (FOR BLOOD PRESSURE SUPPORT)
PREFILLED_SYRINGE | INTRAVENOUS | Status: DC | PRN
  Administered 2024-02-23: 160 ug via INTRAVENOUS
  Administered 2024-02-23: 80 ug via INTRAVENOUS
  Administered 2024-02-23 (×2): 160 ug via INTRAVENOUS
  Administered 2024-02-23: 80 ug via INTRAVENOUS
  Administered 2024-02-23: 160 ug via INTRAVENOUS

## 2024-02-23 MED ORDER — PROPOFOL 10 MG/ML IV BOLUS
INTRAVENOUS | Status: AC
Start: 2024-02-23 — End: 2024-02-23
  Filled 2024-02-23: qty 20

## 2024-02-23 MED ORDER — AMISULPRIDE (ANTIEMETIC) 5 MG/2ML IV SOLN
INTRAVENOUS | Status: AC
Start: 2024-02-23 — End: 2024-02-23
  Filled 2024-02-23: qty 4

## 2024-02-23 MED ORDER — ONDANSETRON HCL 4 MG/2ML IJ SOLN
INTRAMUSCULAR | Status: DC | PRN
  Administered 2024-02-23: 4 mg via INTRAVENOUS

## 2024-02-23 MED ORDER — SUGAMMADEX SODIUM 200 MG/2ML IV SOLN
INTRAVENOUS | Status: DC | PRN
  Administered 2024-02-23 (×2): 100 mg via INTRAVENOUS

## 2024-02-23 MED ORDER — MIDAZOLAM HCL (PF) 2 MG/2ML IJ SOLN
INTRAMUSCULAR | Status: DC | PRN
  Administered 2024-02-23: 2 mg via INTRAVENOUS

## 2024-02-23 MED ORDER — LACTATED RINGERS IV SOLN: INTRAVENOUS | Status: DC

## 2024-02-23 MED ORDER — ONDANSETRON HCL 4 MG/2ML IJ SOLN
INTRAMUSCULAR | Status: AC
  Filled 2024-02-23: qty 2

## 2024-02-23 MED ORDER — OXYCODONE HCL 5 MG PO TABS: 5.0000 mg | ORAL_TABLET | Freq: Once | ORAL | Status: DC | PRN

## 2024-02-23 MED ORDER — POVIDONE-IODINE 10 % EX SWAB
2.0000 | Freq: Once | CUTANEOUS | Status: DC
Start: 2024-02-23 — End: 2024-02-23

## 2024-02-23 MED ORDER — CHLORHEXIDINE GLUCONATE 0.12 % MT SOLN
15.0000 mL | Freq: Once | OROMUCOSAL | Status: AC
  Administered 2024-02-23: 15 mL via OROMUCOSAL
  Filled 2024-02-23: qty 15

## 2024-02-23 MED ORDER — ACETAMINOPHEN 10 MG/ML IV SOLN: 1000.0000 mg | Freq: Once | INTRAVENOUS | Status: DC | PRN

## 2024-02-23 MED ORDER — HEMOSTATIC AGENTS (NO CHARGE) OPTIME
TOPICAL | Status: DC | PRN
  Administered 2024-02-23: 1 via TOPICAL

## 2024-02-23 MED ORDER — BUPIVACAINE HCL (PF) 0.25 % IJ SOLN
INTRAMUSCULAR | Status: AC
Start: 2024-02-23 — End: 2024-02-23
  Filled 2024-02-23: qty 30

## 2024-02-23 MED ORDER — FENTANYL CITRATE (PF) 100 MCG/2ML IJ SOLN: 25.0000 ug | INTRAMUSCULAR | Status: DC | PRN

## 2024-02-23 MED ORDER — LIDOCAINE 2% (20 MG/ML) 5 ML SYRINGE
INTRAMUSCULAR | Status: DC | PRN
  Administered 2024-02-23: 60 mg via INTRAVENOUS

## 2024-02-23 MED ORDER — ROCURONIUM BROMIDE 10 MG/ML (PF) SYRINGE
PREFILLED_SYRINGE | INTRAVENOUS | Status: AC
  Filled 2024-02-23: qty 10

## 2024-02-23 MED ORDER — SODIUM CHLORIDE 0.9 % IR SOLN
Status: DC | PRN
  Administered 2024-02-23: 1000 mL

## 2024-02-23 MED ORDER — OXYCODONE HCL 5 MG/5ML PO SOLN: 5.0000 mg | Freq: Once | ORAL | Status: DC | PRN

## 2024-02-23 MED ORDER — LIDOCAINE 2% (20 MG/ML) 5 ML SYRINGE
INTRAMUSCULAR | Status: AC
  Filled 2024-02-23: qty 5

## 2024-02-23 MED ORDER — BUPIVACAINE HCL (PF) 0.25 % IJ SOLN
INTRAMUSCULAR | Status: DC | PRN
  Administered 2024-02-23: 13 mL

## 2024-02-23 MED ORDER — ACETAMINOPHEN 10 MG/ML IV SOLN
INTRAVENOUS | Status: DC | PRN
  Administered 2024-02-23: 1000 mg via INTRAVENOUS

## 2024-02-23 MED ORDER — AMISULPRIDE (ANTIEMETIC) 5 MG/2ML IV SOLN
10.0000 mg | Freq: Once | INTRAVENOUS | Status: AC
  Administered 2024-02-23: 10 mg via INTRAVENOUS

## 2024-02-23 MED ORDER — PHENYLEPHRINE HCL-NACL 20-0.9 MG/250ML-% IV SOLN
INTRAVENOUS | Status: DC | PRN
  Administered 2024-02-23: 40 ug/min via INTRAVENOUS

## 2024-02-23 MED ORDER — FENTANYL CITRATE (PF) 250 MCG/5ML IJ SOLN
INTRAMUSCULAR | Status: AC
  Filled 2024-02-23: qty 5

## 2024-02-23 MED ORDER — IBUPROFEN 800 MG PO TABS: 800.0000 mg | ORAL_TABLET | Freq: Three times a day (TID) | ORAL | 0 refills | Status: AC | PRN

## 2024-02-23 MED ORDER — KETOROLAC TROMETHAMINE 30 MG/ML IJ SOLN
INTRAMUSCULAR | Status: DC | PRN
  Administered 2024-02-23: 30 mg via INTRAVENOUS

## 2024-02-23 MED ORDER — KETOROLAC TROMETHAMINE 30 MG/ML IJ SOLN
INTRAMUSCULAR | Status: AC
  Filled 2024-02-23: qty 1

## 2024-02-23 MED ORDER — FENTANYL CITRATE (PF) 250 MCG/5ML IJ SOLN
INTRAMUSCULAR | Status: DC | PRN
  Administered 2024-02-23 (×2): 50 ug via INTRAVENOUS

## 2024-02-23 MED ORDER — ROCURONIUM BROMIDE 10 MG/ML (PF) SYRINGE
PREFILLED_SYRINGE | INTRAVENOUS | Status: DC | PRN
  Administered 2024-02-23: 10 mg via INTRAVENOUS
  Administered 2024-02-23: 80 mg via INTRAVENOUS
  Administered 2024-02-23: 10 mg via INTRAVENOUS

## 2024-02-23 MED ORDER — DEXAMETHASONE SOD PHOSPHATE PF 10 MG/ML IJ SOLN
INTRAMUSCULAR | Status: DC | PRN
  Administered 2024-02-23: 10 mg via INTRAVENOUS

## 2024-02-23 MED ORDER — MIDAZOLAM HCL 2 MG/2ML IJ SOLN
INTRAMUSCULAR | Status: AC
Start: 2024-02-23 — End: 2024-02-23
  Filled 2024-02-23: qty 2

## 2024-02-23 MED ORDER — 0.9 % SODIUM CHLORIDE (POUR BTL) OPTIME
TOPICAL | Status: DC | PRN
  Administered 2024-02-23: 1000 mL

## 2024-02-23 MED ORDER — PROPOFOL 10 MG/ML IV BOLUS
INTRAVENOUS | Status: DC | PRN
  Administered 2024-02-23: 180 mg via INTRAVENOUS

## 2024-02-23 MED ORDER — EPHEDRINE SULFATE-NACL 50-0.9 MG/10ML-% IV SOSY
PREFILLED_SYRINGE | INTRAVENOUS | Status: DC | PRN
  Administered 2024-02-23 (×2): 10 mg via INTRAVENOUS

## 2024-02-23 MED ORDER — OXYCODONE HCL 5 MG PO TABS: 5.0000 mg | ORAL_TABLET | Freq: Four times a day (QID) | ORAL | 0 refills | Status: AC | PRN

## 2024-02-23 SURGICAL SUPPLY — 31 items
APPLICATOR ARISTA FLEXITIP XL (MISCELLANEOUS) IMPLANT
BARRIER ADHS 3X4 INTERCEED (GAUZE/BANDAGES/DRESSINGS) IMPLANT
COVER MAYO STAND STRL (DRAPES) ×2 IMPLANT
DERMABOND ADVANCED .7 DNX12 (GAUZE/BANDAGES/DRESSINGS) ×2 IMPLANT
DRAPE SURG IRRIG POUCH 19X23 (DRAPES) ×2 IMPLANT
DRSG OPSITE POSTOP 3X4 (GAUZE/BANDAGES/DRESSINGS) IMPLANT
DURAPREP 26ML APPLICATOR (WOUND CARE) ×2 IMPLANT
GLOVE BIO SURGEON STRL SZ 6 (GLOVE) ×2 IMPLANT
GLOVE BIOGEL PI IND STRL 6.5 (GLOVE) ×4 IMPLANT
GLOVE BIOGEL PI IND STRL 7.0 (GLOVE) ×4 IMPLANT
GOWN STRL REUS W/ TWL LRG LVL3 (GOWN DISPOSABLE) ×2 IMPLANT
HEMOSTAT ARISTA ABSORB 3G PWDR (HEMOSTASIS) IMPLANT
IRRIGATION SUCT STRKRFLW 2 WTP (MISCELLANEOUS) IMPLANT
KIT PINK PAD W/HEAD ARM REST (MISCELLANEOUS) ×2 IMPLANT
KIT TURNOVER KIT B (KITS) ×2 IMPLANT
PACK LAPAROSCOPY BASIN (CUSTOM PROCEDURE TRAY) ×2 IMPLANT
SCISSORS LAP 5X35 DISP (ENDOMECHANICALS) IMPLANT
SET TUBE SMOKE EVAC HIGH FLOW (TUBING) ×2 IMPLANT
SHEARS HARMONIC 36 ACE (MISCELLANEOUS) IMPLANT
SLEEVE Z-THREAD 5X100MM (TROCAR) ×2 IMPLANT
SOLN 0.9% NACL POUR BTL 1000ML (IV SOLUTION) ×2 IMPLANT
SUT VIC AB 3-0 PS2 18XBRD (SUTURE) ×2 IMPLANT
SUT VICRYL 0 UR6 27IN ABS (SUTURE) ×2 IMPLANT
SYSTEM BAG RETRIEVAL 10MM (BASKET) IMPLANT
SYSTEM CARTER THOMASON II (TROCAR) IMPLANT
SYSTEM RETRIEVL 5MM INZII UNIV (BASKET) IMPLANT
TOWEL GREEN STERILE FF (TOWEL DISPOSABLE) ×2 IMPLANT
TRAY FOLEY W/BAG SLVR 14FR (SET/KITS/TRAYS/PACK) ×2 IMPLANT
TROCAR 11X100 Z THREAD (TROCAR) IMPLANT
TROCAR Z-THREAD OPTICAL 5X100M (TROCAR) ×2 IMPLANT
WARMER LAPAROSCOPE (MISCELLANEOUS) ×2 IMPLANT

## 2024-02-23 NOTE — Transfer of Care (Signed)
 Immediate Anesthesia Transfer of Care Note  Patient: Sarah Giles  Procedure(s) Performed: EXCISION, CYST, OVARY, LAPAROSCOPIC, LEFT (Bilateral: Pelvis) SALPINGECTOMY, BILATERAL, LAPAROSCOPIC (Pelvis) LYSIS, ADHESIONS, LAPAROSCOPIC (Pelvis)  Patient Location: PACU  Anesthesia Type:General  Level of Consciousness: awake, alert , and patient cooperative  Airway & Oxygen Therapy: Patient Spontanous Breathing  Post-op Assessment: Report given to RN and Post -op Vital signs reviewed and stable  Post vital signs: Reviewed and stable  Last Vitals:  Vitals Value Taken Time  BP 111/57 02/23/24 09:41  Temp 36.7 C 02/23/24 09:41  Pulse 86 02/23/24 09:45  Resp 17 02/23/24 09:45  SpO2 98 % 02/23/24 09:45  Vitals shown include unfiled device data.  Last Pain:  Vitals:   02/23/24 0636  TempSrc:   PainSc: 0-No pain      Patients Stated Pain Goal: 0 (02/23/24 0636)  Complications: No notable events documented.

## 2024-02-23 NOTE — Anesthesia Procedure Notes (Signed)
 Procedure Name: Intubation Date/Time: 02/23/2024 7:48 AM  Performed by: Atanacio Arland HERO, CRNAPre-anesthesia Checklist: Patient identified, Emergency Drugs available, Suction available and Patient being monitored Patient Re-evaluated:Patient Re-evaluated prior to induction Oxygen Delivery Method: Circle System Utilized Preoxygenation: Pre-oxygenation with 100% oxygen Induction Type: IV induction Ventilation: Mask ventilation without difficulty Laryngoscope Size: Mac and 4 Grade View: Grade I Tube type: Oral Tube size: 7.0 mm Number of attempts: 1 Airway Equipment and Method: Stylet Placement Confirmation: ETT inserted through vocal cords under direct vision, positive ETCO2 and breath sounds checked- equal and bilateral Secured at: 22 cm Tube secured with: Tape Dental Injury: Teeth and Oropharynx as per pre-operative assessment

## 2024-02-23 NOTE — Op Note (Signed)
 Date: 02/23/24  Preoperative Diagnosis: Symptomatic ovarian cysts in setting of chronic pelvic pain, satisfied fertility Postoperative Diagnosis: same as above, stage 4 endometriosis with kissing lesions, omental adhesions Anesthesia: GETA by Dr Leopoldo Procedure: 1) Lysis of adhesions 2) Laparoscopic bilateral salpingectomy 3) Laparoscopic left ovarian cystectomy Surgeon: Lavonia Guppy, MD Assist: Ted Solo, DO An experienced assistant was required given the standard of surgical care given the complexity of the case and maternal body habitus.  This assistant was needed for exposure, dissection, suctioning, retraction, instrument exchange, assisting with delivery with administration of fundal pressure, and for overall help during the procedure.   Intraoperative consult by Dr Ann of trauma surgery, please see separate note Findings: On external exam, genitalia WN:. Preoperative BME reveals immobile anteverted approx 8cm uterus with posterior fornix fullness consistent with preoperative TVUS. Upon laparoscopic view, 1cm omental adhesion to anterior abdominal wall. Bilateral ovaries adherent to each other, posterior uterine body and descending/sigmoid colon such that posterior cul-de-sac view obliterated. No obvious powder burn or other endometrotic lesions noted elsewhere in pelvis or abdomen. Cyst contents of left ovary consistent with chocolate cyst/endometrioma.  Specimens:  1) Pelvic cyst 2) bilateral fallopian tubes 3) left ovarian cyst wall EBL 100 IVF 1300cc UOP 200cc   Consent:  Risks of surgery include infection of the uterus, pelvic organs, or skin, inadvertent injury to internal organs, such as bowel or bladder. If there is major injury, extensive surgery may be required. If injury is minor, it may be treated with relative ease. Discussed possibility of excessive blood loss and transfusion. Patient accepts the possibility of blood transfusion, if necessary. Patient understands  and agrees to move forward with surgery.   Operative Procedure Patient was taken to the operating room where general anesthesia was obtained without difficulty. She was then prepped and draped in the normal sterile fashion in the dorsal lithotomy position. An appropriate timeout was performed. A speculum was then placed within the vagina and a Hulka tenaculum placed within the cervix for uterine manipulation. The bladder was emptied. Attention was then turned to the patient's abdomen after draping where the infraumbilical area was injected with approximately 10 cc of quarter percent Marcaine . A 1 cm incision was then made within the umbilicus and a 5mm trochar was then inserted under direct visualization. Gas flow was then applied and a pneumoperitoneum obtained with approximate 3 L of CO2 gas. With patient in Trendelenburg the uterus and tubes and ovaries were inspected with findings as previously stated. 2 additional ports were placed in LLQ and RLQ by palpating 2cm above and 2cm medial to corresponding ASIS, transilluminating during Marcaine  injeciton and port placement. RLQ port was 5mm, LLQ port 11mm. Omental adhesion noted immediately inferior to mbilicus. Harmonic scalpel was introduced through RLQ port and adhesion taken down with excellent hemostasis Attention turned towards pelvis where kissing lesions and significant bowel adhesions were note.d Minimal mobility of bilateral ovaries with traction on ipsilateral fallopian tubes. Of note, both fallopian tubes appeared grossly WNL. 1cm dark fluid filled cyst adjacent to right fimbriae were removed via [employer] and passed off of field.  Left salpingectomy carried out with Harmonic scalpel, transecting mesosalpinx along length of tube from fimbriae to cornua with excellent hemostasis. Specimen passed off field.  Given adherence of left ovary to inferior bowel as well as patient age, decision made to leave remnant of ovary (healthy appearing) to  bowel. Using harmonic scalpel, the approx 3-4cm cyst was separated from healthy adherent tissue, content spills consistent with endometrioma. Copious  suction irrigation performed during process. After excision, specimen placed in anterior culdesac. Still could not mobilize left ovary in order to visualize CDS or posterior uterine body.  Right salpingectomy was then carried out in similar fashion to left, specimen passed off of field. Right ovary was even more immobile than left, also only approx 2-3cm in total. Dense adhesions to bowel form inferior, medial and anterior aspects of ovary.  At this time, intraoperative consult by Dr Ann was performed to rule out any degree of bowel injury given adhesive burden and prior manipulations. Grossly normal survey, please see separate operative note for full details 39m  endocatch bad introduced into LLQ port, specimen placed in bag and removed without issue in intact bag. Laparoscopic arista and Intercede placed over cauterized left ovarian bed. Fascial closure device then inserted into LLQ port and defect closed with 0-vicryl on UR-6.  The remainder of the pelvis and abdomen were inspected and found to be normal. The instruments were removed from the abdomen and the pneumoperitoneum reduced through the trocar. The trocar was finally removed and the laparoscopic incisions closed with a subcuticular stitch of 3-0 Vicryl. Dermabond was placed. Hulka tenaculum was removed with good hemostasis noted. Patient was then awakened and taken to the recovery room in good condition.

## 2024-02-23 NOTE — Interval H&P Note (Signed)
 History and Physical Interval Note:  02/23/2024 7:28 AM  Sarah Giles  has presented today for surgery, with the diagnosis of ovarian cysts.  The various methods of treatment have been discussed with the patient and family. After consideration of risks, benefits and other options for treatment, the patient has consented to  Procedure(s): EXCISION, CYST, OVARY, LAPAROSCOPIC (Bilateral) as a surgical intervention.  Please note - patient confirmed her desire for bilateral salpingectomy as well today for satisfied fertility. Discussed with husband preoperatively and, given satisfied fertility as well as strong family history of cardiac disease, wants to have procedure today. Consent form ammended appropriately to include laparoscopic bilateral salpingectomy in addition to bilateral ovarian cystectomy. The patient's history has been reviewed, patient examined, no change in status, stable for surgery.  I have reviewed the patient's chart and labs.  Questions were answered to the patient's satisfaction.     Prince Couey M Wesam Gearhart

## 2024-02-24 ENCOUNTER — Encounter (HOSPITAL_COMMUNITY): Payer: Self-pay | Admitting: Obstetrics and Gynecology

## 2024-02-28 LAB — SURGICAL PATHOLOGY

## 2024-03-02 NOTE — Anesthesia Postprocedure Evaluation (Signed)
 Anesthesia Post Note  Patient: Sarah Giles  Procedure(s) Performed: EXCISION, CYST, OVARY, LAPAROSCOPIC, LEFT (Bilateral: Pelvis) SALPINGECTOMY, BILATERAL, LAPAROSCOPIC (Pelvis) LYSIS, ADHESIONS, LAPAROSCOPIC (Pelvis)     Patient location during evaluation: PACU Anesthesia Type: General Level of consciousness: awake and alert Pain management: pain level controlled Vital Signs Assessment: post-procedure vital signs reviewed and stable Respiratory status: spontaneous breathing, nonlabored ventilation and respiratory function stable Cardiovascular status: blood pressure returned to baseline and stable Postop Assessment: no apparent nausea or vomiting Anesthetic complications: no   No notable events documented.                  Shanicka Oldenkamp

## 2024-03-14 DIAGNOSIS — F4322 Adjustment disorder with anxiety: Secondary | ICD-10-CM | POA: Diagnosis not present

## 2024-04-07 DIAGNOSIS — F4322 Adjustment disorder with anxiety: Secondary | ICD-10-CM | POA: Diagnosis not present

## 2024-04-17 DIAGNOSIS — L02412 Cutaneous abscess of left axilla: Secondary | ICD-10-CM | POA: Diagnosis not present

## 2024-04-17 DIAGNOSIS — L089 Local infection of the skin and subcutaneous tissue, unspecified: Secondary | ICD-10-CM | POA: Diagnosis not present

## 2024-04-28 NOTE — Progress Notes (Signed)
 Subjective   HPI:  Sarah Giles is a 34 y.o.  female who presents to the office with:  Chief Complaint  Patient presents with   Axilla Abscess    Follow up for Left Axilla Abscess, possible I&D again, patient's husband is present with her today.    Patient presents today for follow-up of abscess in her left axillary region.  Patient had an I&D performed on 12/22.  She was seen at an urgent care in [place]  on 12/24 with removal of packing.  Patient was placed on doxycycline  and is continued her current dosing.  She has not picked up her extension.  She reports that since her last office visit with myself on 12/30, she has noticed more redness, swelling and pain in the left axillary region.  She previously declined I&D on that date but is wanting to pursue at this time given her reported worsening of symptoms.  No fevers or systemic symptoms    PMH: Past medical history, Past surgical history, Social history, family history were reviewed as noted in EMR.  Pertinent for:  Past Medical History:  Diagnosis Date   Anxiety 2020   Hypertension 2019     Medications and allergies reviewed.  ROS: All systems were negative including constitutional, CV, respiratory, GI/GU except for those noted in the HPI.  Objective   Vital Signs: BP 112/70 (BP Location: Left Upper Arm, Patient Position: Sitting)   Pulse 82   Temp 97 F (36.1 C) (Temporal)   Resp 20   Ht 5' 7 (1.702 m)   Wt (!) 313 lb 6.4 oz (142.2 kg)   LMP 04/11/2024 (Approximate)   SpO2 98%   BMI 49.09 kg/m   Wt Readings from Last 3 Encounters:  04/28/24 (!) 313 lb 6.4 oz (142.2 kg)  04/25/24 (!) 310 lb 9.6 oz (140.9 kg)  02/22/24 (!) 301 lb 3.2 oz (136.6 kg)   Physical Exam Vitals and nursing note reviewed.  Constitutional:      General: She is not in acute distress.    Appearance: She is not toxic-appearing.  HENT:     Head: Normocephalic and atraumatic.  Eyes:     Conjunctiva/sclera: Conjunctivae normal.   Pulmonary:     Effort: Pulmonary effort is normal. No respiratory distress.  Skin:    Findings: Abscess (Left axillary region.  Fluctuance noted.  No overlying erythema.  Tenderness to palpation.  Induration noted in the anterior and inferior aspects of abscess.  No active drainage at this time.) present.  Neurological:     Mental Status: She is alert and oriented to person, place, and time.  Psychiatric:        Mood and Affect: Mood and affect normal.        Cognition and Memory: Memory normal.        Judgment: Judgment normal.      Assessment/Plan   No orders of the defined types were placed in this encounter.   1. Abscess of left axilla    Patient reported worsening of swelling and pain of her left axillary abscess since last office visit on 12/30.  Repeat incision and drainage was performed today in office.  Procedure note was noted below.  Patient tolerated well and had significant amount of purulent and serosanguineous drainage.  No packing was placed.  I encouraged patient to utilize warm compresses over the next 2 to 3 days.  We will switch doxycycline  to Augmentin  as patient had reported some return of symptoms despite continued use  of doxycycline .  Patient will monitor closely and follow-up if any symptoms change or worsen.  I&D Procedure Note: Informed consent obtained; risk and benefit explained to the patient. Agree to proceed. Simple I&D performed in office today. Patient with abscess located left axillary region. Measuring 3 cm in length. Obvious fluctuance and surrounding erythema. Painful to touch. No drainage present. Area prepped with iodine  swabs x 3. 1mL of 1% lidocaine  without epi injected at site of abscess. Tolerated well. Area cleaned again with iodine . Small 1 cm incision performed using #11 scalpel. Large amount of purulent drainage expressed from abscess. Drained until serosanguinous fluid remained. Patient was then cleaned and bandage applied to surface. Patient  advised of proper wound care. Able to ambulate out of office under own power.   No problem-specific Assessment & Plan notes found for this encounter.   Follow up if symptoms worsen or fail to improve.  No future appointments.  Medications at end of visit today: Current Medications[1]  Patient Care Team: Lannie Gell, PA-C as PCP - General (Physician Assistant) Maude FORBES Orange, NP as Nurse Practitioner (Bariatrics) Darliss JAYSON Raymond, PA-C as Physician Assistant (Physician Assistant)         [1]  Current Outpatient Medications:    amoxicillin -clavulanate (AUGMENTIN ) 875-125 mg per tablet, Take one tablet by mouth 2 (two) times daily for 7 days., Disp: 14 tablet, Rfl: 0   metoprolol tartrate (LOPRESSOR) 25 mg tablet, Take one tablet (25 mg dose) by mouth 2 (two) times a day as needed., Disp: 90 tablet, Rfl: 2   spironolactone  (ALDACTONE ) 100 MG tablet, Take one tablet (100 mg dose) by mouth daily., Disp: , Rfl:    Tazarotene  (ARAZLO  EX), Apply topically., Disp: , Rfl:

## 2024-05-04 ENCOUNTER — Ambulatory Visit (INDEPENDENT_AMBULATORY_CARE_PROVIDER_SITE_OTHER): Admitting: Dermatology

## 2024-05-04 ENCOUNTER — Encounter: Payer: Self-pay | Admitting: Dermatology

## 2024-05-04 DIAGNOSIS — L709 Acne, unspecified: Secondary | ICD-10-CM | POA: Diagnosis not present

## 2024-05-04 DIAGNOSIS — L732 Hidradenitis suppurativa: Secondary | ICD-10-CM

## 2024-05-04 DIAGNOSIS — L729 Follicular cyst of the skin and subcutaneous tissue, unspecified: Secondary | ICD-10-CM | POA: Diagnosis not present

## 2024-05-04 MED ORDER — DOXYCYCLINE HYCLATE 100 MG PO CAPS: ORAL_CAPSULE | ORAL | 3 refills | Status: AC

## 2024-05-04 MED ORDER — MUPIROCIN 2 % EX OINT: TOPICAL_OINTMENT | CUTANEOUS | 3 refills | Status: AC

## 2024-05-04 NOTE — Progress Notes (Signed)
 "    Patient (and/or pt guardian) consented to the use of AI-assisted tools for note generation.  Subjective  Sarah Giles is a 33 y.o. female who presents for the following: cyst  Located under left arm that she would like to have examined.  Patient reports the areas have been there for 3 years She reports the areas have previously been bothersome. Patient rates irritation 8 out of 10.  Patient reports that as a child she did have some cystic spots in the groin region, but has not had issues or symptoms in over 20 years with that area Patient reports she has previously been treated at her left underarm     - 04/17/24 patient had cyst drained at an urgent care and was prescribed Doxycycline  and packing was placed in wound - 04/19/24 packing was removed  - 04/28/24 Another I&D was performed. Doxycycline  was discontinued and patient was prescribed Amoxicillin  which she is currently taking  Patient reports that since I&D she has had no other issues with the area   Patient reports she has been using My Magic Healer ointment to the area the last three days and has noticed improvement in how quickly the wound is healing Patient states she uses Dove soap and Degree deodorant  The patient has spots, moles and lesions to be evaluated, some may be new or changing and the patient may have concern these could be cancer.   The following portions of the chart were reviewed this encounter and updated as appropriate: medications, allergies, medical history  Review of Systems:  No other skin or systemic complaints except as noted in HPI or Assessment and Plan.  Objective  Well appearing patient in no apparent distress; mood and affect are within normal limits.  A focused examination was performed of the following areas: Left underarm  Relevant exam findings are noted in the Assessment and Plan.      Assessment & Plan    Cyst left axilla (S/P recent treatment for Abscess) Recurrent cyst  in the left axilla, previously drained and treated with antibiotics. Currently, a 1.5 cm cystic nod is present without signs of inflammation or infection. The cyst is likely due to a clogged oil gland, exacerbated by clothing friction and bacterial infection. Previous treatment with doxycycline  was effective in reducing inflammation and preventing odor during drainage.  - Use antibacterial soap, such as benzoyl peroxide 5%, every other day to reduce bacterial load. - Keep doxycycline  on hand for early intervention if the cyst flares, taking it twice daily for a week with a large meal to avoid gastrointestinal upset. - Apply topical mupirocin  if the cyst flares to aid in healing. - Consider surgical excision if the cyst continues to flare, understanding that residual sac may lead to recurrence.  Acne vulgaris She uses CeraVe oil cleanser and Lipakur moisturizing cleanser, with Arazlo  applied twice a week. Rice toner is used for hydration, and La Roche double repair UV sunscreen is preferred for sun protection.  - Continue current acne regimen with CeraVe oil cleanser, Lipakur moisturizing cleanser, and Arazlo  twice a week. - Ok to use rice toner for hydration. - Continue using La Roche double repair UV sunscreen for sun protection.  HIDRADENITIS SUPPURATIVA   This Visit - doxycycline  (VIBRAMYCIN ) 100 MG capsule - mupirocin  ointment (BACTROBAN ) 2 %  Return if symptoms worsen or fail to improve.  Sarah Giles, am acting as a neurosurgeon for [employer], DO .   Documentation: I have reviewed the above  documentation for accuracy and completeness, and I agree with the above.  Delon Lenis, DO     "

## 2024-05-04 NOTE — Patient Instructions (Addendum)
 VISIT SUMMARY:  During today's visit, we discussed your recurrent cyst in the left axilla, which has developed into an abscess, and your ongoing acne treatment. We reviewed your current treatments and made some adjustments to help manage these conditions more effectively.  YOUR PLAN:  -CYST OF LEFT AXILLA:  Inflamed Cyst (Abscess)  a condition where small, painful lumps form under the skin, often due to clogged oil glands and bacterial infection.   To manage this and prevent recurrence, use antibacterial soap like benzoyl peroxide 5% every other day to reduce bacteria. Keep doxycycline  on hand for early intervention if the cyst flares, taking it twice daily for a week with a large meal to avoid stomach upset.   Apply topical mupirocin  if the cyst flares to aid in healing. If the cyst continues to flare, consider surgical removal, though there is a chance of recurrence.  -ACNE VULGARIS:  Acne vulgaris is a common skin condition that causes pimples and other lesions. Continue your current acne regimen with CeraVe oil cleanser, Lipocaine moisturizing cleanser, and Arazlo  twice a week. Use rice toner for hydration and continue using La Roche double repair UV sunscreen for sun protection.  INSTRUCTIONS:  Please follow up if the cyst flares again or if you experience any new symptoms. Keep using your current skincare routine and medications as discussed. If the cyst continues to flare, we may need to consider surgical options.      Important Information  Due to recent changes in healthcare laws, you may see results of your pathology and/or laboratory studies on MyChart before the doctors have had a chance to review them. We understand that in some cases there may be results that are confusing or concerning to you. Please understand that not all results are received at the same time and often the doctors may need to interpret multiple results in order to provide you with the best plan of care or  course of treatment. Therefore, we ask that you please give us  2 business days to thoroughly review all your results before contacting the office for clarification. Should we see a critical lab result, you will be contacted sooner.   If You Need Anything After Your Visit  If you have any questions or concerns for your doctor, please call our main line at [phone] If no one answers, please leave a voicemail as directed and we will return your call as soon as possible. Messages left after 4 pm will be answered the following business day.   You may also send us  a message via MyChart. We typically respond to MyChart messages within 1-2 business days.  For prescription refills, please ask your pharmacy to contact our office. Our fax number is [phone].  If you have an urgent issue when the clinic is closed that cannot wait until the next business day, you can page your doctor at the number below.    Please note that while we do our best to be available for urgent issues outside of office hours, we are not available 24/7.   If you have an urgent issue and are unable to reach us , you may choose to seek medical care at your doctor's office, retail clinic, urgent care center, or emergency room.  If you have a medical emergency, please immediately call 911 or go to the emergency department. In the event of inclement weather, please call our main line at [phone] for an update on the status of any delays or closures.  Dermatology Medication Tips: Please  keep the boxes that topical medications come in in order to help keep track of the instructions about where and how to use these. Pharmacies typically print the medication instructions only on the boxes and not directly on the medication tubes.   If your medication is too expensive, please contact our office at [phone] or send us  a message through MyChart.   We are unable to tell what your co-pay for medications will be in advance as  this is different depending on your insurance coverage. However, we may be able to find a substitute medication at lower cost or fill out paperwork to get insurance to cover a needed medication.   If a prior authorization is required to get your medication covered by your insurance company, please allow us  1-2 business days to complete this process.  Drug prices often vary depending on where the prescription is filled and some pharmacies may offer cheaper prices.  The website www.goodrx.com contains coupons for medications through different pharmacies. The prices here do not account for what the cost may be with help from insurance (it may be cheaper with your insurance), but the website can give you the price if you did not use any insurance.  - You can print the associated coupon and take it with your prescription to the pharmacy.  - You may also stop by our office during regular business hours and pick up a GoodRx coupon card.  - If you need your prescription sent electronically to a different pharmacy, notify our office through [facility] or by phone at [phone]

## 2025-02-08 ENCOUNTER — Ambulatory Visit: Admitting: Dermatology
